# Patient Record
Sex: Male | Born: 1960
Health system: Southern US, Community
[De-identification: ages and names within clinical notes are randomized; demographics above are authoritative.]

## PROBLEM LIST (undated history)

## (undated) ENCOUNTER — Emergency Department (HOSPITAL_COMMUNITY): Admission: EM | Discharge status: Absent without leave | Payer: Medicaid Other

## (undated) DIAGNOSIS — Q2112 Patent foramen ovale: Secondary | ICD-10-CM

## (undated) DIAGNOSIS — R413 Other amnesia: Secondary | ICD-10-CM

## (undated) DIAGNOSIS — F32A Depression, unspecified: Secondary | ICD-10-CM

## (undated) DIAGNOSIS — Q211 Atrial septal defect: Secondary | ICD-10-CM

## (undated) DIAGNOSIS — F419 Anxiety disorder, unspecified: Secondary | ICD-10-CM

## (undated) DIAGNOSIS — F319 Bipolar disorder, unspecified: Secondary | ICD-10-CM

## (undated) DIAGNOSIS — F329 Major depressive disorder, single episode, unspecified: Secondary | ICD-10-CM

## (undated) DIAGNOSIS — I639 Cerebral infarction, unspecified: Secondary | ICD-10-CM

## (undated) DIAGNOSIS — M1990 Unspecified osteoarthritis, unspecified site: Secondary | ICD-10-CM

## (undated) DIAGNOSIS — D649 Anemia, unspecified: Secondary | ICD-10-CM

## (undated) DIAGNOSIS — T7840XA Allergy, unspecified, initial encounter: Secondary | ICD-10-CM

## (undated) HISTORY — PX: POLYPECTOMY: SHX149

## (undated) HISTORY — DX: Patent foramen ovale: Q21.12

## (undated) HISTORY — DX: Major depressive disorder, single episode, unspecified: F32.9

## (undated) HISTORY — DX: Atrial septal defect: Q21.1

## (undated) HISTORY — DX: Anemia, unspecified: D64.9

## (undated) HISTORY — PX: COLONOSCOPY: SHX174

## (undated) HISTORY — DX: Depression, unspecified: F32.A

## (undated) HISTORY — DX: Anxiety disorder, unspecified: F41.9

## (undated) HISTORY — DX: Other amnesia: R41.3

## (undated) HISTORY — DX: Unspecified osteoarthritis, unspecified site: M19.90

## (undated) HISTORY — PX: RHINOPLASTY: SUR1284

## (undated) HISTORY — DX: Allergy, unspecified, initial encounter: T78.40XA

---

## 1998-07-13 ENCOUNTER — Emergency Department (HOSPITAL_COMMUNITY): Admission: EM | Admit: 1998-07-13 | Discharge: 1998-07-13 | Payer: Self-pay

## 2004-02-07 ENCOUNTER — Ambulatory Visit: Payer: Self-pay | Admitting: Internal Medicine

## 2004-03-15 HISTORY — PX: PATENT FORAMEN OVALE CLOSURE: SHX2181

## 2004-05-01 ENCOUNTER — Ambulatory Visit: Payer: Self-pay | Admitting: Physical Medicine & Rehabilitation

## 2004-05-01 ENCOUNTER — Inpatient Hospital Stay (HOSPITAL_COMMUNITY): Admission: EM | Admit: 2004-05-01 | Discharge: 2004-05-10 | Payer: Self-pay | Admitting: Emergency Medicine

## 2004-05-04 ENCOUNTER — Encounter (INDEPENDENT_AMBULATORY_CARE_PROVIDER_SITE_OTHER): Payer: Self-pay | Admitting: Cardiology

## 2004-05-05 ENCOUNTER — Encounter: Payer: Self-pay | Admitting: Cardiology

## 2004-05-13 ENCOUNTER — Inpatient Hospital Stay (HOSPITAL_COMMUNITY): Admission: EM | Admit: 2004-05-13 | Discharge: 2004-05-22 | Payer: Self-pay | Admitting: Emergency Medicine

## 2004-05-27 ENCOUNTER — Encounter: Admission: RE | Admit: 2004-05-27 | Discharge: 2004-08-25 | Payer: Self-pay | Admitting: Internal Medicine

## 2004-05-27 ENCOUNTER — Ambulatory Visit: Payer: Self-pay | Admitting: Psychology

## 2004-06-01 ENCOUNTER — Ambulatory Visit: Payer: Self-pay | Admitting: Internal Medicine

## 2004-07-13 ENCOUNTER — Ambulatory Visit: Payer: Self-pay | Admitting: Internal Medicine

## 2004-08-06 ENCOUNTER — Ambulatory Visit: Payer: Self-pay | Admitting: Internal Medicine

## 2004-10-05 ENCOUNTER — Encounter: Admission: RE | Admit: 2004-10-05 | Discharge: 2004-10-05 | Payer: Self-pay | Admitting: Cardiology

## 2004-10-09 ENCOUNTER — Ambulatory Visit (HOSPITAL_COMMUNITY): Admission: RE | Admit: 2004-10-09 | Discharge: 2004-10-10 | Payer: Self-pay | Admitting: Cardiology

## 2004-10-29 ENCOUNTER — Ambulatory Visit: Payer: Self-pay | Admitting: Internal Medicine

## 2005-01-26 ENCOUNTER — Ambulatory Visit: Payer: Self-pay | Admitting: Internal Medicine

## 2005-08-06 ENCOUNTER — Ambulatory Visit: Payer: Self-pay | Admitting: Internal Medicine

## 2005-12-02 ENCOUNTER — Ambulatory Visit: Payer: Self-pay | Admitting: Internal Medicine

## 2006-02-05 ENCOUNTER — Emergency Department (HOSPITAL_COMMUNITY): Admission: EM | Admit: 2006-02-05 | Discharge: 2006-02-05 | Payer: Self-pay | Admitting: Family Medicine

## 2006-03-22 ENCOUNTER — Ambulatory Visit: Payer: Self-pay | Admitting: Internal Medicine

## 2006-10-27 DIAGNOSIS — J309 Allergic rhinitis, unspecified: Secondary | ICD-10-CM | POA: Insufficient documentation

## 2006-12-21 ENCOUNTER — Emergency Department (HOSPITAL_COMMUNITY): Admission: EM | Admit: 2006-12-21 | Discharge: 2006-12-21 | Payer: Self-pay | Admitting: Emergency Medicine

## 2007-01-11 ENCOUNTER — Ambulatory Visit: Payer: Self-pay | Admitting: Family Medicine

## 2007-01-11 ENCOUNTER — Ambulatory Visit (HOSPITAL_COMMUNITY): Admission: RE | Admit: 2007-01-11 | Discharge: 2007-01-11 | Payer: Self-pay | Admitting: Family Medicine

## 2007-01-11 LAB — CONVERTED CEMR LAB
Albumin: 4.4 g/dL (ref 3.5–5.2)
Alkaline Phosphatase: 44 units/L (ref 39–117)
Basophils Absolute: 0 10*3/uL (ref 0.0–0.1)
Chloride: 105 meq/L (ref 96–112)
Cholesterol: 158 mg/dL (ref 0–200)
Creatinine, Ser: 1.03 mg/dL (ref 0.40–1.50)
Eosinophils Absolute: 0.4 10*3/uL (ref 0.0–0.7)
HCT: 44 % (ref 39.0–52.0)
LDL Cholesterol: 105 mg/dL — ABNORMAL HIGH (ref 0–99)
MCHC: 32.5 g/dL (ref 30.0–36.0)
MCV: 90.9 fL (ref 78.0–100.0)
Neutrophils Relative %: 59 % (ref 43–77)
PSA: 0.25 ng/mL (ref 0.10–4.00)
Platelets: 242 10*3/uL (ref 150–400)
RBC: 4.84 M/uL (ref 4.22–5.81)
RDW: 12.8 % (ref 11.5–14.0)
Sodium: 140 meq/L (ref 135–145)
Total Bilirubin: 0.9 mg/dL (ref 0.3–1.2)
Total CHOL/HDL Ratio: 3.9

## 2007-02-22 ENCOUNTER — Ambulatory Visit: Payer: Self-pay | Admitting: Family Medicine

## 2007-05-19 ENCOUNTER — Ambulatory Visit: Payer: Self-pay | Admitting: *Deleted

## 2007-05-19 ENCOUNTER — Ambulatory Visit: Payer: Self-pay | Admitting: Family Medicine

## 2007-05-24 ENCOUNTER — Ambulatory Visit: Payer: Self-pay | Admitting: Family Medicine

## 2007-12-21 ENCOUNTER — Ambulatory Visit: Payer: Self-pay | Admitting: Internal Medicine

## 2008-07-22 ENCOUNTER — Ambulatory Visit: Payer: Self-pay | Admitting: Internal Medicine

## 2008-08-16 ENCOUNTER — Ambulatory Visit: Payer: Self-pay | Admitting: Family Medicine

## 2008-11-12 ENCOUNTER — Ambulatory Visit: Payer: Self-pay | Admitting: Family Medicine

## 2008-11-12 LAB — CONVERTED CEMR LAB
HDL: 56 mg/dL (ref >39)
LDL Cholesterol: 84 mg/dL (ref 0–99)
Total CHOL/HDL Ratio: 2.7
Triglycerides: 43 mg/dL (ref <150)
VLDL: 9 mg/dL (ref 0–40)

## 2008-11-19 ENCOUNTER — Ambulatory Visit: Payer: Self-pay | Admitting: Family Medicine

## 2009-04-22 ENCOUNTER — Ambulatory Visit: Payer: Self-pay | Admitting: Family Medicine

## 2009-07-17 ENCOUNTER — Ambulatory Visit: Payer: Self-pay | Admitting: Family Medicine

## 2009-07-23 ENCOUNTER — Ambulatory Visit: Payer: Self-pay | Admitting: Internal Medicine

## 2010-01-13 ENCOUNTER — Encounter (INDEPENDENT_AMBULATORY_CARE_PROVIDER_SITE_OTHER): Payer: Self-pay | Admitting: Family Medicine

## 2010-01-13 LAB — CONVERTED CEMR LAB
BUN: 17 mg/dL (ref 6–23)
Basophils Absolute: 0 10*3/uL (ref 0.0–0.1)
CO2: 28 meq/L (ref 19–32)
Calcium: 9.4 mg/dL (ref 8.4–10.5)
Chloride: 102 meq/L (ref 96–112)
Creatinine, Ser: 1.15 mg/dL (ref 0.40–1.50)
Free T4: 1.21 ng/dL (ref 0.80–1.80)
HCT: 44.4 % (ref 39.0–52.0)
Helicobacter Pylori Antibody-IgG: <0.4
Hemoglobin: 14.7 g/dL (ref 13.0–17.0)
MCV: 87.7 fL (ref 78.0–100.0)
Monocytes Absolute: 0.6 10*3/uL (ref 0.1–1.0)
Neutro Abs: 4.6 10*3/uL (ref 1.7–7.7)
Potassium: 4.7 meq/L (ref 3.5–5.3)
Sodium: 138 meq/L (ref 135–145)

## 2010-07-31 NOTE — Cardiovascular Report (Signed)
Andrew Kaiser, Andrew Kaiser              ACCOUNT NO.:  [id number]   MEDICAL RECORD NO.:  [id number]          PATIENT TYPE:  OIB   LOCATION:  2899                         FACILITY:  [facility]   PHYSICIAN:  [name], MD       DATE OF BIRTH:  03/08/1961   DATE OF PROCEDURE:  DATE OF DISCHARGE:                              CARDIAC CATHETERIZATION   ADDENDUM:  Please note this is an addendum to the previous dictation.   PERFORMING PHYSICIAN:  Dr. Yates Decamp.   SECOND ASSISTANT:  Dr. Lenise Herald.       JRG/MEDQ  D:  10/09/2004  T:  10/09/2004  Job:  [id number]

## 2010-07-31 NOTE — H&P (Signed)
Andrew Kaiser, Andrew Kaiser              ACCOUNT NO.:  [id number]   MEDICAL RECORD NO.:  [id number]          PATIENT TYPE:  INP   LOCATION:  1823                         FACILITY:  [facility]   PHYSICIAN:  Corinna L. Lendell Caprice, MDDATE OF BIRTH:  [birth year]   DATE OF ADMISSION:  05/13/2004  DATE OF DISCHARGE:                                HISTORY & PHYSICAL   CHIEF COMPLAINT:  Vomiting.   HISTORY OF PRESENT ILLNESS:  Andrew Kaiser is a 50 year old black male who was  recently hospitalized on our service for stroke which was presumed to be  embolic, but there was no source of embolism found on transthoracic or  transesophageal echocardiogram, who had significant cognitive judgment,  memory impairment and was deemed incompetent to make informed medical  decisions or give consent.  He eloped from the hospital on the 26th of  February and apparently his family had been looking for him; they found him  today and his mother had some commitment papers drawn up by the magistrate  and he was brought to the emergency room by police.  There is some reported  history of vomiting today several times.  He has no abdominal pain.  He was  awaiting placement in the hospital and his workup was essentially complete.  He had been placed on Aggrenox b.i.d. and had been found to have abnormal  protein C and S levels which could have been reactive and the plan was to  repeat these in 4 weeks.  The patient is hungry currently and does not feel  that he needs any further treatment for his stroke.   PAST MEDICAL HISTORY:  As above.   MEDICATIONS:  He had been on Aggrenox in the hospital.   SOCIAL HISTORY:  He had a urine drug screen that was positive for marijuana  on his last hospitalization.   FAMILY HISTORY:  His sister also had a stroke and DVT at age 54.   REVIEW OF SYSTEMS:  Review of systems as above, otherwise negative.   PHYSICAL EXAMINATION:  VITAL SIGNS:  Please see nursing notes for vital  signs.  GENERAL:  In general, the patient is well-developed, well-nourished, calm,  in no acute distress on the stretcher.  HEENT:  Normocephalic, atraumatic.  Pupils are equal, round and reactive to  light.  Sclerae anicteric.  Mucous membranes moist.  Extraocular movements  are intact.  NECK:  Neck is supple.  No carotid bruits.  LUNGS:  Lungs are clear to auscultation bilaterally without wheezes, rhonchi  or rales.  CARDIOVASCULAR:  Regular rate and rhythm without murmurs, gallops or rubs.  ABDOMEN:  Normal bowel sounds.  Soft, nontender and non-distended.  GU AND RECTAL:  Deferred.  EXTREMITIES:  No clubbing, cyanosis, or edema.  SKIN:  No rash.  PSYCHIATRIC:  The patient is calm and cooperative.  NEUROLOGIC:  The patient is alert and oriented.  Cranial nerves and  sensorimotor exam are intact.  His speech is fluent.  He does not remember  me and has poor memory.  He continues to display poor reason and judgment,  as he feels that he needs  no further treatment for this stroke and denies  that this major stroke was much of a problem for him.   LABORATORIES:  CBC is normal.  Complete metabolic panel normal.   ASSESSMENT AND PLAN:  1.  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  The patient continues to      have deficits in memory, judgment and reasoning.  He is unsafe to care      for himself, as evidenced by his recent impulsive behavior and eloping      from the hospital, and having disappeared from his family members for 2      days.  The patient will be admitted back to the hospital.  I will      continue physical therapy, occupational therapy, speech therapy and get      case manager involved for placement.  I have signed involuntary      commitment papers for medical reasons, as the patient does not have the      capacity to give informed consent.  I will continue Aggrenox.  2.  Vomiting:  He will get  as-needed Phenergan.      CLS/MEDQ  D:  05/13/2004  T:  05/13/2004  Job:  [id number]

## 2010-07-31 NOTE — Procedures (Signed)
HISTORY:  This is a 50 year old patient who is being evaluated for  confusion.  Patient was felt to have had seizure-like activity at home.  Patient is being evaluated for possible epilepsy.   This is a portable EEG recording.  No skull defects are noted.  Medications  include aspirin, Ativan, Phenergan.   EEG CLASSIFICATION:  Normal awake.   DESCRIPTION OF RECORDING:  The background rhythm of this recording consists  of a fairly well modulated medium amplitude alpha rhythm of 9 Hz that is  reactive to eye opening and closure.  As the record progresses, patient  appears to remain in awakened state throughout the recording.  At no time  during the recording does there appear to be evidence of spike, spike wave  discharges or evidence of focal slowing.  Hyperventilation and photic  stimulation were not performed during the recording.  EKG monitor shows no  evidence of cardiac rhythm abnormalities with a heart rate of 66.   IMPRESSION:  This is a normal EEG recording in awakened state.  No evidence  of ictal or interictal discharges were seen.      ZOX:WRUE  D:  05/04/2004 12:39:30  T:  05/04/2004 21:23:13  Job #:  [id number]

## 2010-07-31 NOTE — Consult Note (Signed)
NAME:  Andrew Kaiser, WIECK NO.:  [id number]   MEDICAL RECORD NO.:  [id number]          PATIENT TYPE:  INP   LOCATION:  3102                         FACILITY:  [facility]   PHYSICIAN:  Melvyn Novas, M.D.  DATE OF BIRTH:  Sep 18, 1960   DATE OF CONSULTATION:  05/01/2004  DATE OF DISCHARGE:                                   CONSULTATION   This an only 50 year old and actually much younger-appearing African-  American right-handed gentleman, who presents to the ER with a sudden  confusional spell that was thought to be possibly postictal.  He had no past  medical history problems, past medical history of alcohol or drug use, and  works for a Administrator, [employer].  At 7:30 p.m. on February 16, he felt the right  side of his body not responding.  The right leg appeared weak, and  apparently he had a similar spell already on October 15.  Symptoms ceased by  the time EMS arrived.  The patient's sister had noticed that also motor  symptoms were improving to the level where they became no longer  recognizable and the patient was still not able to follow verbal commands  very well and seemed to be unable to comprehend.  He was therefore admitted  through the [facility] ER to the hospitalist service.  Corinna L. Lendell Caprice,  M.D., admitted the patient and could get no information on medication,  illness history or family history.  He has apparently had a sister that had  a stroke at age 75.  He has adult and healthy children.   PHYSICAL EXAMINATION:  GENERAL:  The patient appears afebrile and  normocephalic.  VITAL SIGNS:  Blood pressure was only 123/50, pulse rate 76, respiratory  rate 18.  The patient's Chem-7 appears normal, and he had a negative drug  screen and no abnormalities on PT and INR.  NEUROLOGIC:  Pupils reactive, equal to light and accommodation.  He has full  extraocular movement but appears aphasic, but he can follow commands  occasionally but not continuously or  consistently.  He had undergone an MRI  of the brain to see if any left hemispheric abnormalities occurred.  Indeed,  the MRI returned yesterday on May 01, 2004, positive for a left  temporoparietal acute stroke as well as for a right ACA stroke.  Dr.  Lendell Caprice had already ordered a 2 D echocardiogram, carotid studies, PT, OT  and ST evaluation and a possible rehab consult.  An MRA of the brain was  added.  The patient's sister, Dr. Eden Lathe, a neonatologist, was  involved in the decision-making process and is kept up about our progress.  After the patient was returned from MRI, we continued our neurologic exam.  The patient is alert, has expressive aphasia, tries to speak but cannot  express himself.  He also is unable to repeat.  He has no dysarthria,  however.  The words he can articulate are clear.  He can follow simple motor  commands.  He is moving all extremities.  Also, he has mild right-sided  pronator drift and he has already  deep tendon reflex asymmetry with  prominent brisk reflexes throughout the right body hemisphere.  His hand  grip on the right is slightly weaker than on the left, but he is able to  perform.  He has a decreased right nasolabial fold.  I was able to elicit an  upgoing toe on the right.  The overall motor function on the right is just  mildly impaired at this point.  We were not able to detect any carotid  bruits.   Due to fall precautions, the patient was asked to stay in the bed.  We will  ask PT to get him up out of bed three times a day, and the patient's further  workup for hypercoagulability and other sources of embolic stroke is  pending.   DIAGNOSIS:  Embolic strokes by two different foci, left and right  hemisphere, acute.  No cardiac history or hypercoagulability history known.  Sickle cell test returned already negative.  Sedimentation rate was 2.  All  other tests are pending.      CD/MEDQ  D:  05/02/2004  T:  05/04/2004  Job:   [id number]   cc:   Corinna L. Lendell Caprice, MD

## 2010-07-31 NOTE — Discharge Summary (Signed)
NAMEDEMICHAEL, TRAUM              ACCOUNT NO.:  [id number]   MEDICAL RECORD NO.:  [id number]          PATIENT TYPE:  INP   LOCATION:  5712                         FACILITY:  [facility]   PHYSICIAN:  Jackie Plum, M.D.DATE OF BIRTH:  08-30-1960   DATE OF ADMISSION:  05/12/2004  DATE OF DISCHARGE:                                 DISCHARGE SUMMARY   DISCHARGE DIAGNOSES:  1.  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  Patient is deemed incompetent.  2.  Vomiting, resolved.   DISCHARGE MEDICATIONS:  1.  Aggrenox one capsule b.i.d.  2.  Aricept 5 mg q.h.s.  3.  Ativan p.r.n. 0.5 t.i.d.   DISCHARGE LABORATORIES:  Sodium 142, potassium 3.9, chloride 109, CO2 27,  glucose 88, BUN 14, creatinine 0.9, calcium 9.0.   CONSULTS:  Not applicable.   PROCEDURES:  Not applicable.   CONDITION ON DISCHARGE:  Improved, satisfactory.   CONSULTS:  Dr. Jeanie Sewer of psychiatry   REASON FOR ADMISSION:  Please see admission H&P dictated by Dr. Lendell Caprice  dated May 13, 2004 for full assessment and plan and patient's presentation.  The patient was admitted for stroke.  During that admission work-up  including echocardiogram was unremarkable.  In fact, the cause of his stroke  was not categorically known.  However, he was started on Aggrenox for  presumptive embolic source.  At that time his drug screen was positive for  marijuana.  While he was in the hospital patient was awaiting for placement  and at that time he eloped and was subsequently found by the mother and  brought to the hospital.  On admission he had some vomiting without any  other significant symptomatic complaints.  Was admitted for placement and  anti-emesis.   HOSPITAL COURSE:  Patient was admitted to the hospitalists service.  There  were no significant issues.  No further work-up was initiated.  He  was seen  by Dr. Jeanie Sewer who deemed the patient not competent to make decisions and  recommended starting Aricept which was initiated.  Patient was planned for  skilled nursing facility placement.  However, in view of the fact that he  was ambulatory and the fact that his cognition improved somewhat, it was  deemed that the patient was appropriate for 24-hour care which his mother  agreed to give patient at home.  He is being discharged with outpatient  follow-up at [facility] on May 27, 2004.  He also  has appointment to see Dr. Leonides Cave a neuropsychologist for follow-up.  He is  discharged home in stable satisfactory condition   Discharge instructions.  Patient's family report to [employer]  or Bostyn Bogie of choice if he has any other problems.      GO/MEDQ  D:  05/22/2004  T:  05/22/2004  Job:  [id number]

## 2010-07-31 NOTE — Discharge Summary (Signed)
NAME:  Andrew Kaiser, Andrew Kaiser              ACCOUNT NO.:  [id number]   MEDICAL RECORD NO.:  [id number]          PATIENT TYPE:  INP   LOCATION:  3024                         FACILITY:  [facility]   PHYSICIAN:  Melissa L. Ladona Ridgel, MD  DATE OF BIRTH:  [birth year]   DATE OF ADMISSION:  05/01/2004  DATE OF DISCHARGE:  05/10/2004                                 DISCHARGE SUMMARY   COMMENT:  Please note, the patient left the hospital without telling anyone  on May 10, 2004.   DISCHARGING DIAGNOSES:  1.  Stroke.  The patient's stroke involved the right anterior cerebral      artery and the left anterior middle cerebral artery.  The event was      highly suspicious for an embolic event and the event appeared subacute.      The patient was left with a residual aphasia and she remained with      decreased decision-making capacity.  The patient was being evaluated for      placement for speech rehabilitation at the time of his elopement.  2.  Nausea and vomiting.  This was likely secondary to Aggrenox being taken      on an empty stomach.  This has resolved at the time of his elopement.   MEDICATIONS AT TIME OF DISCHARGE:  1.  Aggrenox one tablet b.i.d. x2 weeks.  2.  Protonix 40 mg daily.   HISTORY OF PRESENT ILLNESS:  The patient is a 50 year old African American  male who presented to the emergency room with seizure-like activity  witnessed by his son.  On the day of admission, the patient was witnessed to  have fallen to the ground and showed some shaking of his right arm and right  leg and lasted 10-15 seconds.  The patient was brought to the emergency room  for further evaluation.  On evaluation, the patient was alert, but had an  expressive aphasia, was unable to follow commands and appeared to have no  insight into his disease.   HOSPITAL COURSE:  The patient was worked up from a hypercoagulability  standpoint, obtaining a protein C, protein S, factor V Leiden, antithrombin  III, a  cardiolipin antibody and lupus anticoagulant, as well as an RPR.  He  was initially treated with aspirin, which was converted to Aggrenox.  During  the course of the stay, the patient worked with PT, OT and speech therapy to  attempt to correct some of his deficits.  Carotid Dopplers were also  completed, which showed no obvious source for emboli.  EEG was undertaken,  which did not confirm any seizure-like activity.  Because of the extent of  his injury and his inability to make good judgments for himself or  differentiate between good choices and bad choices, the patient was  maintained in the hospital.  We assisted with bringing his mother home, who  was going to help [place] with his care and the recommendation was that the  patient should go to an outpatient rehab for further speech therapy as well  as cognitive therapy.  During the course of the hospital stay,  the patient  did complain of some leg pain.  Dopplers of the lower extremities were  completed which showed no DVT.  Pain resolved without any difficulty.  The  patient's hospital course was further complicated by mild nausea and  vomiting related to taking Aggrenox.  We made some adjustments on when his  dose was given and started him on a PPI for mild gastritis, which was  diagnosed empirically.  Psychiatry was asked to evaluate the patient for  capacity, found him to have impaired judgment and recommended that he be  committed if he were to wish to leave the hospital.  Recommendations were  made for mood and behavioral control.  On May 10, 2004, the patient was  noted to have left the floor; his destination was not known and he was not  accompanied by anyone.  Commitment papers were completed on his behavior in  the likelihood that he would return to the hospital;  unfortunately, the patient did not return to the hospital.  His family was  notified of him leaving and [facility] police were also alerted.  At this  time, there  is no further information in the record regarding his final  destination nor the outcome of his elopement.      MLT/MEDQ  D:  06/29/2004  T:  06/29/2004  Job:  [id number]   cc:   Melvyn Novas, M.D.  [address]  [address]  [place]  [place] [address]  Fax: [phone]

## 2010-07-31 NOTE — Cardiovascular Report (Signed)
NAMEREVIS, WHALIN              ACCOUNT NO.:  [id number]   MEDICAL RECORD NO.:  [id number]          PATIENT TYPE:  OIB   LOCATION:  2899                         FACILITY:  [facility]   PHYSICIAN:  [name], MD       DATE OF BIRTH:  06-Oct-1960   DATE OF PROCEDURE:  10/09/2004  DATE OF DISCHARGE:                              CARDIAC CATHETERIZATION   PROCEDURE:  ICE (intracardiac echocardiogram).   Using sterile precautions and 8-French left femoral venous access an  intracardiac echocardiogram catheter was easily advanced into the inferior  vena cava and into the right atrium and the cardiac structures were  carefully analyzed.  Then the PFO was confirmed by color Doppler  examination.  It was a small PFO measuring 3.5-5 mm.   Post procedural ICE confirmed excellent position of the 23mm CardioSEAL  septal occluder with no residual shunting either by color Doppler or by  double contrast injection.   Procedure for CardioSEAL intracardiac occluder implantation:  Under sterile  precautions initially using an 8-French right femoral venous access a  multiple purpose A2 catheter was utilized to cross into the left atrium.  Left atrial position was confirmed by doing oxygen saturation.  Then after  giving 5000 units of heparin and ACT maintained at more than 250 a 0.035  inch by 260 cm Rosen J-wire was introduced into the left atrium and into the  left upper pulmonary vein.  Then an 11-French PFO closure transeptal sheath  Criselda Peaches) was introduced through the IVC and the sheath was gently crossed  over to the left atrium through the PFO.  All these maneuvers were confirmed  through the ICE.  Then we used a sizing balloon to size the PFO.  A 25 mm  PFO closure balloon was utilized.  The PFO measured about 5 mm.  Hence, it  was decided to proceed with use of a 23 mm CardioSEAL septal occluder  device.  Then, the Rosen wire was withdrawn after the [facility] sheath was  introduced in the left  atrium and the CardioSEAL septal occluder delivery  catheter was introduced through the [place] sheath and the left atrial side  of the device was released and confirmed through the ICE.  The device was  gently pulled back to the site of the PFO and the right atrial side of the  occluder device was then released.  Excellent positioning and sandwiching of  the intra-atrial septum was confirmed through the ICE.  Then the device was  released without any complications.  Both color Doppler and a double  contrast injection was performed post procedure.  Then the [facility] sheath was  withdrawn out of the body in the usual fashion and a short 11-French sheath  was introduced through the right femoral vein and sutured in place.  During  the procedure a total of 7000 units of IV heparin was administered and ACT  was maintained at greater than  250.  Patient tolerated the procedure well.  There was no immediate  complications noted.  RECOMENDATIONS: Continue plavix for 3 months with aggrenox and aggrenox or  asa  indefinately. 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       JRG/MEDQ  D:  10/09/2004  T:  10/09/2004  Job:  [id number]   cc:   Gordy Savers, M.D. [facility]   Pramod P. Pearlean Brownie, MD  Fax: [phone]

## 2010-07-31 NOTE — H&P (Signed)
[name], Kaiser              ACCOUNT NO.:  [id number]   MEDICAL RECORD NO.:  [id number]          PATIENT TYPE:  OIB   LOCATION:  2899                         FACILITY:  [facility]   PHYSICIAN:  Andrew Hilts. Jacinto Halim, MD       DATE OF BIRTH:  03/07/1961   DATE OF ADMISSION:  10/09/2004  DATE OF DISCHARGE:                                HISTORY & PHYSICAL   PROCEDURES PERFORMED:  1.  Intracardiac echocardiogram.  2.  Closure of the atrial level septal defect with a CardioSEAL septal      occluder.   BRIEF HISTORY:  Andrew Kaiser is a 50 year old African-American male with no  significant prior cerebrovascular disease history.  Was admitted to [facility] on May 01, 2004 with new onset of embolic stroke in his right  anterior cerebral artery and left middle cerebral artery.  He had a strongly  positive PCD bubble study.   PAST MEDICAL HISTORY:  As dictated above.  No diabetes, hypertension.   CURRENT MEDICATIONS:  Aggrenox one p.o. b.i.d.   REVIEW OF SYSTEMS:  No bowel or bladder changes.  No new neurologic  weakness.  Other systems were negative.   ALLERGIES:  No known drug allergies.   SOCIAL HISTORY:  He is divorced.  He has one son.  Lives with his mother.  He drinks occasional beer.  He does use cigars one or two a day which he  quit in March of 2006.   FAMILY HISTORY:  There is family history of myocardial infarction.  Father  died at age of 82.  Mother had a stroke at the age of 16.   PHYSICAL EXAMINATION:  GENERAL:  He is well-built, nourished.  He appears to  be in no acute distress.  VITAL SIGNS:  Heart rate 76 beats per minute, respirations 16, blood  pressure 120/76.  CARDIAC:  S1, S2 was normal.  No gallop, no murmur.  CHEST:  Bilateral equal breath sounds.  No crackles.  ABDOMEN:  Benign.   IMPRESSION:  1.  Embolic cerebrovascular accident on February 17th, 2006, affecting his      right anterior communicating artery and left middle cerebral artery with  residual memory, judgment, and reasoning defects which are completely      improved.  2.  Redundant atrial septum with patent foramen ovale.  3.  Would proceed with PFO closure . Risks, alternatives and benefits have      been discussed previously.       JRG/MEDQ  D:  10/09/2004  T:  10/09/2004  Job:  [id number]

## 2011-01-13 ENCOUNTER — Emergency Department (HOSPITAL_COMMUNITY)
Admission: EM | Admit: 2011-01-13 | Discharge: 2011-01-14 | Disposition: A | Discharge status: Other Acute Inpatient Hospital | Payer: Medicaid Other | Source: Home / Self Care | Attending: Emergency Medicine | Admitting: Emergency Medicine

## 2011-01-13 ENCOUNTER — Emergency Department (HOSPITAL_COMMUNITY): Payer: Medicaid Other

## 2011-01-13 DIAGNOSIS — F29 Unspecified psychosis not due to a substance or known physiological condition: Secondary | ICD-10-CM | POA: Insufficient documentation

## 2011-01-13 DIAGNOSIS — R079 Chest pain, unspecified: Secondary | ICD-10-CM | POA: Insufficient documentation

## 2011-01-13 DIAGNOSIS — S52609A Unspecified fracture of lower end of unspecified ulna, initial encounter for closed fracture: Secondary | ICD-10-CM | POA: Insufficient documentation

## 2011-01-13 DIAGNOSIS — F309 Manic episode, unspecified: Secondary | ICD-10-CM | POA: Insufficient documentation

## 2011-01-13 LAB — DIFFERENTIAL
Basophils Absolute: 0 10*3/uL (ref 0.0–0.1)
Basophils Relative: 0 % (ref 0–1)
Eosinophils Relative: 1 % (ref 0–5)
Lymphocytes Relative: 15 % (ref 12–46)
Lymphs Abs: 1.3 10*3/uL (ref 0.7–4.0)
Monocytes Absolute: 1.1 10*3/uL — ABNORMAL HIGH (ref 0.1–1.0)
Monocytes Relative: 12 % (ref 3–12)
Neutrophils Relative %: 72 % (ref 43–77)

## 2011-01-13 LAB — COMPREHENSIVE METABOLIC PANEL
Albumin: 4.4 g/dL (ref 3.5–5.2)
BUN: 14 mg/dL (ref 6–23)
CO2: 25 mEq/L (ref 19–32)
Creatinine, Ser: 1.04 mg/dL (ref 0.50–1.35)
GFR calc Af Amer: >90 mL/min (ref >90)
Total Protein: 8 g/dL (ref 6.0–8.3)

## 2011-01-13 LAB — CBC
Platelets: 282 10*3/uL (ref 150–400)
RDW: 13.6 % (ref 11.5–15.5)
WBC: 9 10*3/uL (ref 4.0–10.5)

## 2011-01-13 LAB — RAPID URINE DRUG SCREEN, HOSP PERFORMED
Opiates: NOT DETECTED
Tetrahydrocannabinol: NOT DETECTED

## 2011-01-14 ENCOUNTER — Emergency Department (HOSPITAL_COMMUNITY): Payer: Medicaid Other

## 2011-01-14 ENCOUNTER — Inpatient Hospital Stay (HOSPITAL_COMMUNITY)
Admission: AD | Admit: 2011-01-14 | Discharge: 2011-01-21 | DRG: 885 | Disposition: A | Discharge status: Home / Self Care | Payer: Medicaid Other | Attending: Psychiatry | Admitting: Psychiatry

## 2011-01-14 DIAGNOSIS — Z8673 Personal history of transient ischemic attack (TIA), and cerebral infarction without residual deficits: Secondary | ICD-10-CM

## 2011-01-14 DIAGNOSIS — I672 Cerebral atherosclerosis: Secondary | ICD-10-CM

## 2011-01-14 DIAGNOSIS — Z560 Unemployment, unspecified: Secondary | ICD-10-CM

## 2011-01-14 DIAGNOSIS — I679 Cerebrovascular disease, unspecified: Secondary | ICD-10-CM | POA: Diagnosis not present

## 2011-01-14 DIAGNOSIS — Z79899 Other long term (current) drug therapy: Secondary | ICD-10-CM

## 2011-01-14 DIAGNOSIS — S52609A Unspecified fracture of lower end of unspecified ulna, initial encounter for closed fracture: Secondary | ICD-10-CM

## 2011-01-14 DIAGNOSIS — X58XXXA Exposure to other specified factors, initial encounter: Secondary | ICD-10-CM

## 2011-01-14 DIAGNOSIS — F3113 Bipolar disorder, current episode manic without psychotic features, severe: Secondary | ICD-10-CM | POA: Diagnosis present

## 2011-01-15 DIAGNOSIS — F3110 Bipolar disorder, current episode manic without psychotic features, unspecified: Secondary | ICD-10-CM

## 2011-01-15 LAB — TESTOSTERONE, % FREE: Testosterone-% Free: 1.2 % — ABNORMAL LOW (ref 1.6–2.9)

## 2011-01-15 MED ORDER — LAMOTRIGINE 25 MG PO TABS
25.0000 mg | ORAL_TABLET | Freq: Every day | ORAL | Status: DC
  Administered 2011-01-17 – 2011-01-18 (×2): 25 mg via ORAL
  Filled 2011-01-15 (×3): qty 1

## 2011-01-15 MED ORDER — TRAZODONE HCL 100 MG PO TABS
100.0000 mg | ORAL_TABLET | Freq: Every evening | ORAL | Status: DC | PRN
  Administered 2011-01-17 – 2011-01-20 (×2): 100 mg via ORAL
  Filled 2011-01-15: qty 1
  Filled 2011-01-15: qty 15

## 2011-01-15 MED ORDER — DIVALPROEX SODIUM ER 500 MG PO TB24
500.0000 mg | ORAL_TABLET | Freq: Every day | ORAL | Status: DC
  Administered 2011-01-17 – 2011-01-18 (×2): 500 mg via ORAL
  Filled 2011-01-15 (×3): qty 1

## 2011-01-15 MED ORDER — ALUM & MAG HYDROXIDE-SIMETH 200-200-20 MG/5ML PO SUSP: 30.0000 mL | ORAL | Status: DC | PRN

## 2011-01-15 MED ORDER — ACETAMINOPHEN 325 MG PO TABS
650.0000 mg | ORAL_TABLET | Freq: Four times a day (QID) | ORAL | Status: DC | PRN
Start: 2011-01-15 — End: 2011-01-21
  Administered 2011-01-19 – 2011-01-20 (×2): 650 mg via ORAL

## 2011-01-15 MED ORDER — LAMOTRIGINE 100 MG PO TABS
100.0000 mg | ORAL_TABLET | Freq: Every day | ORAL | Status: DC
  Administered 2011-01-17 – 2011-01-21 (×5): 100 mg via ORAL
  Filled 2011-01-15 (×5): qty 1

## 2011-01-15 MED ORDER — MAGNESIUM HYDROXIDE 400 MG/5ML PO SUSP: 30.0000 mL | Freq: Every day | ORAL | Status: DC | PRN

## 2011-01-17 DIAGNOSIS — F3113 Bipolar disorder, current episode manic without psychotic features, severe: Secondary | ICD-10-CM | POA: Diagnosis present

## 2011-01-17 DIAGNOSIS — I679 Cerebrovascular disease, unspecified: Secondary | ICD-10-CM | POA: Diagnosis not present

## 2011-01-17 NOTE — Progress Notes (Signed)
Pt is intrusive and hyperverbal   He took the cast off his arm this morning and has requested to put it back on this afternoon  To shoot some baskets in the gym   He is grandiose and has racing thoughts   He also requested a smoking patch even though he smokes a cigarette about once a week     Verbal support given  Medications administered and effectiveness monitored   Set limits and discuss safety issues to prevent further injury to his uncasted arm   Q 15 min checks   Pt safe at present

## 2011-01-17 NOTE — Progress Notes (Signed)
[facility] Group Notes:  (Counselor/Nursing/MHT/Case Management/Adjunct) 01/17/2011 1100 AM  Type of Therapy:  Group Therapy, Dance/Movement Therapy   Participation Level:  Active, then left group early  Participation Quality:  Attentive, Redirectable and Sharing  Affect:  Appropriate  Cognitive:  Oriented and Confused  Insight:  Good  Engagement in Group:  Good  Engagement in Therapy:  Good  Modes of Intervention:  Clarification, Problem-solving, Role-play, Socialization and Support  Summary of Progress/Problems: Pt participated in a group on identifing positive supports. Pt spoke about using God as his main support. Pt left group early   [facility] 01/17/2011, 12:00 PM

## 2011-01-17 NOTE — Progress Notes (Signed)
[facility] MD Progress Note  01/17/2011 2:54 PM Subjective: Pt. Was resting comfortably in bed during psych visit today.  He is pleasant and cooperative and voices no new complaints.  Diagnosis:  Axis I: Bipolar, Manic  ADL's:  Intact  Sleep:  Yes,  AEB:  Appetite:  Yes,  AEB:  Suicidal Ideation:   Plan:  No  Intent:  No  Means:  No  Homicidal Ideation:   Plan:  No  Intent:  No  Means:  No  AEB (as evidenced by):  Mental Status: General Appearance Luretha Murphy:  Casual Eye Contact:  Good Motor Behavior:  Restlestness paces through out visit and stands very close to examiner with out regard for personal space. Speech:  Normal and Rate:a little faster than normal. Level of Consciousness:  Alert Mood:  Euthymic Affect:  Appropriate Anxiety Level:  None Thought Process:  Tangential Thought Content:  WNL Perception:  Normal Judgment:  Fair Insight:  Absent Cognition:  Orientation time, place and person Memory Remote Concentration Yes Sleep:     Vital Signs:Blood pressure 114/71, pulse 80, temperature 98.2 F (36.8 C), temperature source Oral, resp. rate 20, height 6\' 1"  (1.854 m), weight 179 lb 15.7 oz (81.64 kg).  Lab Results: No results found for this or any previous visit (from the past 48 hour(s)).  Physical Findings: AIMS:  , ,  ,  ,    CIWA:    COWS:     Treatment Plan Summary: Daily contact with patient to assess and evaluate symptoms and progress in treatment Medication management  Plan: Monitor depakote levels during the week.  Andrew Kaiser 01/17/2011, 2:54 PM

## 2011-01-18 MED ORDER — RISPERIDONE 2 MG PO TABS
2.0000 mg | ORAL_TABLET | Freq: Every day | ORAL | Status: DC
  Administered 2011-01-18: 2 mg via ORAL
  Filled 2011-01-18 (×2): qty 1

## 2011-01-18 NOTE — Progress Notes (Signed)
Recreation Therapy Group Note  Date: 01/18/2011          Time: 0930      Group Topic/Focus: The focus of this group is on promoting emotional and psychological well-being through the process of creative expression, relaxation, socialization, fun and enjoyment.   Participation Level: Minimal  Participation Quality: Redirectable and Resistant   Affect: Labile  Cognitive: Oriented   Additional Comments: Patient contradictory through much of group. Patient first reported he didn't sleep at all and was too tired to participate, then said he slept "great" and participated actively. Patient left group early, reporting he was too tired and needed to lay down, but came back five minutes later saying five minutes is all the sleep he requires.

## 2011-01-18 NOTE — Progress Notes (Signed)
Pt was up in dayroom upon first assessment.  He was given his self-assessment to fill out.  He rated his depression a 1 and hopelessness a 1 today.  He did go down for breakfast and claims to be eating well.   He denies any A/V hallucinations.   He is intrusive and has to be redirected a lot.  He has been up for most groups and is interacting appropriate for the most part.  He did state,"I have been told today by the case manager and counselor that I am manic and can't stop talking"  He went on to say that he now plans not to speak in groups until spoken to and that way he can show he is ready for discharge.  He did say he doesn't understand why he is still here.  He stated,"I haven't been home since last Tuesday and I am ready to leave.  He plans to return home at time of discharge.  His mother did come here this morning with a bag of medications that dated back to 2003.  Discussed with her that pt is on certain medications here and that once he is stabilized on which ever medications that the doctor feels is working then that will be the meds he is sent home to take.  Explained the ways she could destroy his old meds and she voiced understanding.  Pt did ask about if he could have another cast or not.  Informed him he would have to talk with Dr. Rogers Blocker about the plan for his arm.  Pt denies any discomfort and no new order in relation to his arm.
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
## 2011-01-18 NOTE — Progress Notes (Signed)
Adult Services Patient-Family Contact/Session  Attendees:  Case Manager, Mother Amair Shrout, RN Robbie Louis for part of session  Goal(s):  Mother brought home medications for review, had questions  Safety Concerns:  What to do with old medications  Narrative:  RN reviewed the medications and gave Mother options re disposal of the medications.  Advised Mother to discard all meds, new and old, to have a fresh start when patient leaves the hospital.  Mother also had questions for Case Manager about patient applying for Medicaid and about his eligibility for disability.  Case Manager gave her a Medicaid application to start that process.  He has already been denied disability 3 times, is working with attorney Howell Pringle.  Case Manager to complete Releases so that information from this hospitalization can be sent to attorney and Optometrist.  Disability request is based on 3 strokes suffered 6 years ago, all originating in different parts of the brain.  Patient was "normal" prior to those strokes, has only been delusional about having served in the [employer] and been childlike since then.  Mother states he talks about being [place] and living in the "[place]" which is a shed out back of the house which he has cleared out for his things.  She also stated he will not take his medications at home.  She wondered if he would be eligible for an ACTT service, but this is his first psychiatric hospitalization.  Case Manager told her about [facility].  Barrier(s):  None  Interventions:  Complete Releases for attorney and SSA.  Give brochure re [facility].  Give application for Medicaid  Recommendation(s):  Encourage patient to attend [facility] and pursue disability application.  Follow-up Required:  No  Explanation:  Completed already  Sarina Ser 01/18/2011, 3:21 PM

## 2011-01-18 NOTE — Progress Notes (Signed)
[facility] MD Progress Note  01/18/2011 11:47 AM  Diagnosis:  Axis I: Bipolar, Manic  ADL's:  Intact  Sleep:  No  Appetite:  Yes,  AEB:  Suicidal Ideation:   Plan:  No  Intent:  No  Means:  No  Homicidal Ideation:   Plan:  No  Intent:  No  Means:  No  AEB (as evidenced by):  Mental Status: General Appearance Andrew Kaiser:  Neat Eye Contact:  Fair Motor Behavior:  Normal Speech:  Normal Level of Consciousness:  Alert Mood:  Euphoric Affect:  Labile Anxiety Level:  Minimal Thought Process:  Disorganized Thought Content:  Rumination Perception:  Normal Judgment:  Fair Insight:  Present Cognition:  Orientation time, place and person Sleep:     Vital Signs:Blood pressure 159/111, pulse 82, temperature 97.4 F (36.3 C), temperature source Oral, resp. rate 16, height 6\' 1"  (1.854 m), weight 81.64 kg (179 lb 15.7 oz).  Lab Results: No results found for this or any previous visit (from the past 48 hour(s)).  Physical Findings: AIMS:  , ,  ,  ,    CIWA:    COWS:     Treatment Plan Summary: Daily contact with patient to assess and evaluate symptoms and progress in treatment Medication management   Plan: added risperdal 2mg  po bed time  AHLUWALIA,SHAMSHER S 01/18/2011, 11:47 AM

## 2011-01-18 NOTE — Discharge Planning (Signed)
Patient attended Aftercare Planning Group, expressed feeling that he is ready to leave the hospital, challenged Case Manager to tell him why he is even in the hospital.  States he is here involuntarily, and incensed that there is no reason for him to be forced into the hospital.  Does not believe he is manic.  Pleasant throughout group, but excessively talkative and intrusive.  Difficult to redirect, as he did not feel there was anything wrong with him monopolizing the group's time.  Threatened to leave group if not allowed to speak freely.  Case Manager was able to get him to stay, as there were only a few minutes left at that point.  Reported that he never gets more than 4 hours sleep at night, and that is broken up.  Feels this is normal for him, and does not expect/want any changes in that sleep pattern.  Patient also reported that he drinks one-two 24-oz beers daily, usually one in the morning and one in the evening.  Will often add shots of moonshine several times weekly.  Very jocular about this, does not consider this to be a problem.  States he is not addicted to anything.  Bragged also about how he asked the RN to give him a nicotine patch even though he does not smoke sufficient to warrant this.  Wanted to know how the cravings would feel.  No case management needs today.  Sarina Ser 01/18/2011 3:21 PM

## 2011-01-18 NOTE — Progress Notes (Signed)
[facility] Group Notes:  (Counselor/Nursing/MHT/Case Management/Adjunct)  01/18/2011 1:19 PM  Type of Therapy: Group Therapy  Participation Level:  Active  Participation Quality:  Intrusive, Monopolizing and Redirectable  Affect:  Labile  Cognitive:  Oriented  Insight:  Limited  Engagement in Group:  Good  Engagement in Therapy:  Good  Modes of Intervention:  Clarification, Education, Problem-solving and Support  Summary of Progress/Problems: Patient continues to have very little insight into his mania. He stated that he might be hypomanic but he has never been a harm to others. Also stated he liked being this way. Counselor pointed out consequences to mania including his not being able to sleep. He was able to make general statements that if someone doesn't get sleep then their body can shut down and not his mind would not  think straight. He became argumentative later in the group and stated that he never gets consecutive hours of sleep and his usual is only about 4 hours a night. Stated that he was going to go crazy if he didn't get out and get some exercise. Also stated that if he could exercise, this might help him sleep. Patient went from pleasant discussion to stating I'm not going to say anything. Stated that family doesn't approve of him because he is different. Continued to express delusional material of being in the service.   Andrew Kaiser, Aram Beecham 01/18/2011, 1:19 PM

## 2011-01-18 NOTE — Progress Notes (Signed)
Pt remains manic and intrusive. He is asking if we can recast his arm. When asked why he took off the cast he responds, "I didn't. Someone else did." He did attend wrap up group this evening and has been up and around the milieu. Pt redirected as needed without difficulty, reminded of behavioral expectations on unit. Trazadone given for sleep per pt's request. He denies SI/HI/AVH stating, "I never had any." Pt remains safe. Edsel Petrin RN

## 2011-01-19 LAB — URINALYSIS, ROUTINE W REFLEX MICROSCOPIC
Leukocytes, UA: NEGATIVE
Nitrite: NEGATIVE
Specific Gravity, Urine: 1.023 (ref 1.005–1.030)
Urobilinogen, UA: 0.2 mg/dL (ref 0.0–1.0)
pH: 6 (ref 5.0–8.0)

## 2011-01-19 MED ORDER — DIVALPROEX SODIUM ER 500 MG PO TB24
1000.0000 mg | ORAL_TABLET | Freq: Every day | ORAL | Status: DC
  Administered 2011-01-19 – 2011-01-20 (×2): 1000 mg via ORAL
  Filled 2011-01-19 (×3): qty 2

## 2011-01-19 MED ORDER — LAMOTRIGINE 25 MG PO TABS
50.0000 mg | ORAL_TABLET | Freq: Every day | ORAL | Status: DC
  Administered 2011-01-19 – 2011-01-20 (×2): 50 mg via ORAL
  Filled 2011-01-19 (×3): qty 2

## 2011-01-19 MED ORDER — RISPERIDONE 3 MG PO TABS
3.0000 mg | ORAL_TABLET | Freq: Every day | ORAL | Status: DC
  Administered 2011-01-19 – 2011-01-20 (×2): 3 mg via ORAL
  Filled 2011-01-19 (×3): qty 1

## 2011-01-19 NOTE — Progress Notes (Signed)
[facility] MD Progress Note  01/19/2011 9:56 AM  Diagnosis:  Axis I: Bipolar, Manic  ADL's:  Intact  Sleep:  Yes,  AEB:  Appetite:  Yes,  AEB:  Suicidal Ideation:   Plan:  No  Intent:  No  Means:  No  Homicidal Ideation:   Plan:  No  Intent:  No  Means:  No  AEB (as evidenced by):  Mental Status: General Appearance Luretha Murphy:  Casual Eye Contact:  Fair Motor Behavior:  Normal Speech:  Normal Level of Consciousness:  Alert Mood:  Euphoric less Affect:  Labile less Anxiety Level:  Minimal Thought Process:  Relevant Thought Content:  WNL Perception:  Normal Judgment:  Fair Insight:  Present Cognition:  Memory Remote fair Sleep:     Vital Signs:Blood pressure 112/70, pulse 102, temperature 97.7 F (36.5 C), temperature source Oral, resp. rate 20, height 6\' 1"  (1.854 m), weight 81.64 kg (179 lb 15.7 oz).  Lab Results: No results found for this or any previous visit (from the past 48 hour(s)).   Treatment Plan Summary: Increased his meds today depakote to 1000, risperdal to 3mg  , lamictal to 150mg .  Plan:  AHLUWALIA,SHAMSHER S 01/19/2011, 9:56 AM

## 2011-01-19 NOTE — Progress Notes (Signed)
Pt was up in dayroom upon first assessment.  He was given his self-assessment to fill out.  He rated both his depression and hopelessness a 0 again today.  He went back to bed after his am med was given.  He would not wake up for the MHT but he did get up once this nurse went in and reminded him that he wanted to show staff that he could "act right" in groups.  He went on to say that he planned to not talk in groups unless it was his turn or if he had something to add to another patient.  He was very drowsy in the first group and seemed to have a hard time keeping his eyes open.  He did let everyone know that he was listening but he just had his eyes closed to "meditate".   He has been up for the rest of groups and is interacting appropriate for the most part.  He had some medication changes today and he is aware and voiced understanding.  He plans to return home at time of discharge.

## 2011-01-19 NOTE — Progress Notes (Signed)
[facility] Group Notes:  (Counselor/Nursing/MHT/Case Management/Adjunct)  01/19/2011 1:55 PM  Type of Therapy:  Group Therapy  Participation Level:  Active  Participation Quality:  Attentive, Intrusive and Sharing  Affect:  Angry and Irritable  Cognitive:  Oriented  Insight:  Limited  Engagement in Group:  Good  Engagement in Therapy:  Good  Modes of Intervention:  Clarification and Education  Summary of Progress/Problems: Patient continues to be angry about being in the hospital. Micah Flesher through the same discussion from yesterday. Stated that he likes being up and won't trade this for anything. Argued about his sleep and the fact that his family wants him to act a certain way. Not willing to talk about being balanced in his mood. Kept interrupting others but then was getting mad because nobody was talking. Asked to leave the group several times because the hospital was not helping him.   Lavonda Thal, Aram Beecham 01/19/2011, 1:55 PM

## 2011-01-19 NOTE — Progress Notes (Addendum)
Patient attended Aftercare Planning Group, but was late, then left briefly, returned, then left for the remaining time.  Complained of sedation.  Although yesterday had talked about sleeping enough, now states is "sleeping better than when got here."  Acknowledges that he has hired attorney re disability application and appeal, and is in agreement to send records from this hospitalization for inclusion in that process.  Ambrose Mantle, LCSW 01/19/2011  1109AM  Progress on patient goals:  1.  Reduce mania to baseline:  Progress being made through medication management, increased sleep.  Patient was significantly less hyperverbal during Aftercare Planning Group.  2.   Reduce delusions to baseline:  Patient did not complain of any psychosis and was not noted to respond to any internal stimuli; however, previous RN notes indicate he still focuses on delusion that he was in the [employer] and that his mother is responsible for his hospitalization.  3.  Increase sleep to adequate level, i.e. 6 hours nightly for several nights in a row:  Patient slept more by self-report last night, although 2 hours is noted in Epic.  Ambrose Mantle, LCSW 01/19/2011  1115AM

## 2011-01-20 NOTE — Progress Notes (Signed)
Recreation Therapy Group Note  Date: 01/20/2011         Time: 0930      Group Topic/Focus: The focus of this group is on enhancing the patient's understanding of leisure, barriers to leisure, and the importance of engaging in positive leisure activities upon discharge for improved total health.   Participation Level: Active  Participation Quality: Appropriate and Attentive  Affect: Blunted  Cognitive: Oriented   Additional Comments: Patient calmer today, reporting he is sleeping better although its still during the day.   Nija Koopman 01/20/2011 10:05 AM

## 2011-01-20 NOTE — Progress Notes (Signed)
[facility] Group Notes:  (Counselor/Nursing/MHT/Case Management/Adjunct)  01/20/2011 2:31 PM  Type of Therapy:  Group Therapy  Participation Level:  Active  Participation Quality:  Attentive, Drowsy and Sharing  Affect:  Blunted  Cognitive:  Oriented  Insight:  Good  Engagement in Group:  Good  Engagement in Therapy:  Good  Modes of Intervention:  Education, Support and Exploration  Summary of Progress/Problems: Patient was more sedated today because of his medications. He acknowledged that sleep is important to his body. He knows that he should get sleep but continues to state that it is a choice of when he gets his sleep. Confronted him that he might have to change pattern according to those in his household. He talked about in the past he has worked third shift job and has gotten used to sleeping during the day.   [name] 01/20/2011, 2:31 PM

## 2011-01-20 NOTE — Progress Notes (Signed)
Pt was pleasant, cooperative, and continues to use humor in good taste.  Stated that his goal was to close his mouth and do more listening. Pt feels that he accomplished his goal. Support and encouragement were offered.
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
## 2011-01-20 NOTE — Progress Notes (Signed)
Pt was up in dayroom upon first assessment.  He was given his self-assessment to fill out.  He rated his depression a 1 and hopelessness a 1 today.  He has been up for most groups and is interacting appropriate for the most part.  As the day progressed, he seemed to be more appropriate and not as loud and intrusive.   He plans to return home at time of discharge which could possibly be tomorrow.  No new med orders but did request prn tylenol at 1012 for pain in his arm.

## 2011-01-20 NOTE — Progress Notes (Signed)
  Pt continues to improve and be more appropriate on the unit. "This place has been beneficial, because I've gotten the flu and pnue vaccine, good food, nice people and staff". Stated he's had a pleasant stay but if he stays too long it may become unpleasant. Support and encouragement were offered.

## 2011-01-20 NOTE — Progress Notes (Addendum)
Patient seen in Aftercare Planning Group.  He expressed readiness for discharge.  States he slept from 10:30-3:00 and then went back to bed and slept from 3:30 to waking time.  Does not plan to keep up current routine at home, as he plans to sleep during the day ("I love sleeping during the day.") and stay awake at night.  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  Patient made follow-up appointments for patient at [facility] [place].  Medication management will be with Wenda Overland on 02/08/11 at 1:30 and therapy will be with Will Lovey Newcomer 01/27/11 at 1:00.  Progress on patient goals:  1. Reduce mania to baseline: Goal met. 2. Reduce delusions to baseline: Patient did not complain of any psychosis and was not noted to respond to any internal stimuli; however, remains delusional about [employer] and being [place]. 3. Increase sleep to adequate level, i.e. 6 hours nightly for several nights in a row: Patient slept significantly more last night by self-report, and time was not noted in Epic.  Likely discharge tomorrow.  Ambrose Mantle, LCSW 01/20/2011, 3:17 PM   Noting the medications Pt currently is on, Case Manager did extensive research and provided Pt with Patient Assistance Program info on [employer], [employer], and Patient Assistance.  Ambrose Mantle, LCSW 01/20/2011, 3:53 PM

## 2011-01-21 MED ORDER — LAMOTRIGINE 100 MG PO TABS: 100.0000 mg | ORAL_TABLET | Freq: Every day | ORAL | Status: DC

## 2011-01-21 MED ORDER — DIVALPROEX SODIUM ER 500 MG PO TB24: 1000.0000 mg | ORAL_TABLET | Freq: Every day | ORAL | Status: DC

## 2011-01-21 MED ORDER — RISPERIDONE 3 MG PO TABS: 3.0000 mg | ORAL_TABLET | Freq: Every day | ORAL | Status: DC

## 2011-01-21 MED ORDER — LAMOTRIGINE 25 MG PO TABS: 50.0000 mg | ORAL_TABLET | Freq: Every day | ORAL | Status: DC

## 2011-01-21 NOTE — Progress Notes (Signed)
  Pt is in bed resting with eyes closed. Respirations were even and unlabored. Pt appears in No acute distress, Q15 checks continue, pt remains safe on the unit.

## 2011-01-21 NOTE — Progress Notes (Signed)
[facility] Group Notes:  (Counselor/Nursing/MHT/Case Management/Adjunct)  01/21/2011 1:36 PM  Type of Therapy:  Group Therapy  Participation Level:  Active  Participation Quality:  Attentive and Sharing  Affect:  Blunted  Cognitive:  Oriented  Insight:  Limited  Engagement in Group:  Good  Engagement in Therapy:  Good  Modes of Intervention:  Education, Problem-solving and Support  Summary of Progress/Problems: Patient was less drowsy and more subdued. He was less insightful today, and stated that there was nothing wrong with not sleeping for 7 days. He stated that he had done this because he was trying to work on his house before mother got back home. He also stated he liked the Zoloft because it gave him an additional high.    Taiya Nutting, Aram Beecham 01/21/2011, 1:36 PM

## 2011-01-21 NOTE — Progress Notes (Signed)
  Patient discharged home with mother.  Patient received all his belongings, medications and prescriptions.    Suicide prevention information pamphlet given to patient and discussed with patient.  Stated he understood and had no questions.  Denied SI and HI.  Denied A/V hallucinations.  Denied pain.  Excited to be able to go home.  Stated he received all his belongings, prescriptions, medications and understands all his discharge instructions.  Stated he appreciate all the staff has done to help him while at [facility].

## 2011-01-21 NOTE — Discharge Summary (Signed)
Physician Discharge Summary  Patient ID: Andrew Kaiser MRN: [id number] DOB/AGE: 1960-04-15 50 y.o.  Admit date: 01/14/2011 Discharge date: 01/21/2011  Admission Diagnoses: Bipolar disorder under remission   Discharge Diagnoses:  Active Problems:  Bipolar disorder, current episode manic without psychotic features, severe  Cerebral vascular disease   Discharged Condition: stable  Hospital Course: 50 year old male with history of bipolar disorder who was admitted at behavioral health as he was having racing thoughts wasn't not able to sleep properly. Patient denied any suicidal ideations at the time of admission denied hearing any voices at the time of admission. Patient was started on Lamictal as he was taking it before admission to the hospital. The new medications added were Depakote and Risperdal patient responded to these medications well without any side effects. Patient was also taking Zoloft before admission he was advised to discontinue the Zoloft as it can lead him to relapse to  mania. Patient participated in all the groups No agitation reported by the staff patient , mother was contacted and she didn't have any safety concerns for the patient at the time of discharge. At the time of discharge patient is  logical and goal directed not hallucinating or delusional, he is alert awake oriented x3 his insight and judgment improved. He was not having any flight of ideas. Patient was also seen in the treatment team and everybody was agreeable for discharge for this patient. Because his mania resolved and he is compliant with his medication and is motivated to followup in the outpatient setting he does not have any suicidal risk factor. Psychoeducation given to the patient at time of discharge regarding compliance with the medication and followup in the outpatient setting.   Discharge Exam: Blood pressure 116/71, pulse 94, temperature 98.2 F (36.8 C), temperature source Oral, resp. rate  18, height 6\' 1"  (1.854 m), weight 81.64 kg (179 lb 15.7 oz).   Disposition: Home  Current Discharge Medication List    START taking these medications   Details  divalproex (DEPAKOTE ER) 500 MG 24 hr tablet Take 2 tablets (1,000 mg total) by mouth at bedtime. Qty: 30 tablet, Refills: 0    !! lamoTRIgine (LAMICTAL) 100 MG tablet Take 1 tablet (100 mg total) by mouth daily. Qty: 30 tablet, Refills: 0    !! lamoTRIgine (LAMICTAL) 25 MG tablet Take 2 tablets (50 mg total) by mouth at bedtime. Qty: 30 tablet, Refills: 0    risperiDONE (RISPERDAL) 3 MG tablet Take 1 tablet (3 mg total) by mouth at bedtime. Qty: 15 tablet, Refills: 0     !! - Potential duplicate medications found. Please discuss with provider.    CONTINUE these medications which have NOT CHANGED   Details  !! lamoTRIgine (LAMICTAL) 100 MG tablet Take 100 mg by mouth daily.      zolpidem (AMBIEN) 10 MG tablet Take 10 mg by mouth at bedtime as needed. For insomnia      !! - Potential duplicate medications found. Please discuss with provider.    STOP taking these medications     sertraline (ZOLOFT) 100 MG tablet      traZODone (DESYREL) 50 MG tablet        Follow-up Information    Follow up with HICKMAN,MICHELE E, PA on 02/08/2011. (Appointment scheduled at 1:30PM)    Contact information:   [facility] [address] [place] [address] [phone]       Follow up with Candi Leash, counselor on 01/27/2011. (Appointment scheduled  at 1:00PM)    Contact information:   [facility] [place] [address] [place] [phone]         Signed: Katheren Puller 01/21/2011, 11:33 AM

## 2011-01-21 NOTE — Discharge Summary (Signed)
Discharge Note  Date of Admission:  01/14/2011  Date of Discharge:  01/21/2011   Level of Care:  OP  Discharge destination:  Home  Is patient on multiple antipsychotic therapies at discharge:  No    Has Patient had three or more failed trials of antipsychotic monotherapy by history:  No  Patient phone:  [phone] (home)  Patient address:   [address] [place] [address],   Katheren Puller 01/21/2011, 11:10 AM

## 2011-01-21 NOTE — Discharge Planning (Signed)
Met with Patient in Aftercare Planning Group, where he did express readiness for discharge.  Went over Patient Assistance programs, card, processes at length with patient, and advised that he involve his mother in working on these.    Discharge Plan: Go home with Mother, who will pick up. Go to [facility] of the [facility] 01/27/11 at 1:00 for therapy with Will Lovey Newcomer and 02/08/11 at 1:30 for medication management with [facility]. Advised patient to go to [facility] throughout week for supports and education.  Denies SI, HI, A/V hallucinations.  There are no remaining barriers to discharge.  Ambrose Mantle, LCSW 01/21/2011, 11:47 AM

## 2011-01-21 NOTE — Progress Notes (Signed)
Patient discharged home today.  Denies SI/HI/AVH.  Patient has appropriate behavior and affect.  Patient's belongings returned to him.  He was given samples and prescriptions to take with him.

## 2011-01-21 NOTE — Treatment Plan (Signed)
Interdisciplinary Treatment Plan Update (Adult)  Date:  01/21/2011 Time Reviewed:  9:23 AM  Progress in Treatment: Attending groups: Yes. and As evidenced by:  documented in chart Participating in groups:  Yes. and As evidenced by:  full disclosure and interaction Taking medication as prescribed:  Yes.  No refusals noted. Tolerating medication:  Yes.  No side effects noted or reported. Family/Significant othe contact made:  Yes, individual(s) contacted:  mother [name] Patient understands diagnosis:  Yes.  Verbalizes understanding of the sleep issues and the impact on his behavior.  Has not throughout hospital acknowledged mania. Discussing patient identified problems/goals with staff:  Yes.  Fully interactive re all issues. Medical problems stabilized or resolved:  As evidenced by:  talking through all issues as they come up Denies suicidal/homicidal ideation: Yes. and As evidenced by:  self-report Issues/concerns per patient self-inventory:  No. Other:  New problem(s) identified: No, Describe:  Patient is stable for discharge  Reason for Continuation of Hospitalization: Other; describe Patient to discharge today  Interventions implemented related to continuation of hospitalization:  Not applicable as patient is discharging today  Additional comments:  N/A  Estimated length of stay:  D/C today  Discharge Plan:  Go home with Mother, who will pick up.  Go to [facility] of the [facility] 01/27/11 at 1:00 for therapy with Will Lovey Newcomer and 02/08/11 at 1:30 for medication management with [facility].  Advised patient to go to [facility] throughout week for supports and education.  New goal(s):  Not applicable  Review of initial/current patient goals per problem list:   1.  Goal(s):  Reduce mania to baseline  Met:  Yes  Target date:  By Discharge   As evidenced by:  Patient remains calm and appropriate at all times, is able to interact appropriately without  interruptions and intrusions  2.  Goal (s):  Reduce delusions to baseline.  Met:  Yes  Target date:  By Discharge   As evidenced by:  Patient still believes he used to be in the [employer]; this is a delusion he had during period of time prior to admission even when well.  Other delusions seem to be in remission.  3.  Goal(s):  Increase sleep to adequate level, regularly.  Met:  Yes  Target date:  By Discharge   As evidenced by:  Self-report of good sleep   Attendees: Patient:  Andrew Kaiser 11/8/20129:23 AM  Family:   11/8/20129:23 AM  Physician:  Dr. Gwenyth Bouillon Zaide Mcclenahan 11/8/20129:23 AM  Nursing:   Izola Price, RN, BSN 11/8/20129:23 AM  Case Manager:  Sarina Ser, LCSW 11/8/20129:23 AM  Counselor:  Veto Kemps, MT-BC 11/8/20129:23 AM  Other:  Lorinda Creed, NP student 11/8/20129:23 AM  Other:  Wilmon Arms, counseling intern 11/8/20129:23 AM  Other:   11/8/20129:23 AM  Other:   11/8/20129:23 AM   Scribe for Treatment Team:   Sarina Ser, 01/21/2011, 9:23 AM  2

## 2011-01-21 NOTE — Progress Notes (Signed)
Suicide Risk Assessment  Discharge Assessment     Demographic factors:    Current Mental Status:    Risk Reduction Factors:     CLINICAL FACTORS:  Bipolar disorder under current admission.  COGNITIVE FEATURES THAT CONTRIBUTE TO RISK:  None  SUICIDE RISK:   Minimal: No identifiable suicidal ideation.  Patients presenting with no risk factors but with morbid ruminations; may be classified as minimal risk based on the severity of the depressive symptoms  PLAN OF CARE:  Kebin Maye S 01/21/2011, 1:49 PM
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
## 2011-01-25 NOTE — Progress Notes (Signed)
Patient Discharge Instructions:  Dictated admission note faxed, Date faxed:  01/22/11 D/C instructions faxed, Date faxed:  01/22/11 D/C Summary faxed, Date faxed:  01/22/11 Med. Rec. Form faxed, Date faxed:  01/22/11  Lolita Cram nita, 01/25/2011, 10:47 AM

## 2011-02-03 ENCOUNTER — Encounter: Payer: Self-pay | Admitting: Emergency Medicine

## 2011-02-03 ENCOUNTER — Emergency Department (HOSPITAL_COMMUNITY): Payer: Medicaid Other

## 2011-02-03 ENCOUNTER — Emergency Department (HOSPITAL_COMMUNITY)
Admission: EM | Admit: 2011-02-03 | Discharge: 2011-02-03 | Disposition: A | Discharge status: Home / Self Care | Payer: Medicaid Other | Attending: Emergency Medicine | Admitting: Emergency Medicine

## 2011-02-03 DIAGNOSIS — Z8673 Personal history of transient ischemic attack (TIA), and cerebral infarction without residual deficits: Secondary | ICD-10-CM | POA: Insufficient documentation

## 2011-02-03 DIAGNOSIS — S52209A Unspecified fracture of shaft of unspecified ulna, initial encounter for closed fracture: Secondary | ICD-10-CM

## 2011-02-03 DIAGNOSIS — Z79899 Other long term (current) drug therapy: Secondary | ICD-10-CM | POA: Insufficient documentation

## 2011-02-03 DIAGNOSIS — S5290XD Unspecified fracture of unspecified forearm, subsequent encounter for closed fracture with routine healing: Secondary | ICD-10-CM | POA: Insufficient documentation

## 2011-02-03 HISTORY — DX: Cerebral infarction, unspecified: I63.9

## 2011-02-03 MED ORDER — IBUPROFEN 800 MG PO TABS
800.0000 mg | ORAL_TABLET | Freq: Once | ORAL | Status: AC
  Administered 2011-02-03: 800 mg via ORAL
  Filled 2011-02-03: qty 1

## 2011-02-03 MED ORDER — IBUPROFEN 800 MG PO TABS: 800.0000 mg | ORAL_TABLET | Freq: Three times a day (TID) | ORAL | Status: AC
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
## 2011-02-03 NOTE — ED Provider Notes (Signed)
Medical screening examination/treatment/procedure(s) were performed by non-physician practitioner and as supervising physician I was immediately available for consultation/collaboration.  [name] Sou, MD 02/03/11 1729

## 2012-11-27 ENCOUNTER — Emergency Department (HOSPITAL_COMMUNITY): Payer: Medicaid Other

## 2012-11-27 ENCOUNTER — Encounter (HOSPITAL_COMMUNITY): Payer: Self-pay | Admitting: *Deleted

## 2012-11-27 ENCOUNTER — Emergency Department (HOSPITAL_COMMUNITY)
Admission: EM | Admit: 2012-11-27 | Discharge: 2012-11-27 | Disposition: A | Discharge status: Home / Self Care | Payer: Medicaid Other | Attending: Emergency Medicine | Admitting: Emergency Medicine

## 2012-11-27 DIAGNOSIS — F3112 Bipolar disorder, current episode manic without psychotic features, moderate: Secondary | ICD-10-CM | POA: Insufficient documentation

## 2012-11-27 DIAGNOSIS — Z8673 Personal history of transient ischemic attack (TIA), and cerebral infarction without residual deficits: Secondary | ICD-10-CM | POA: Insufficient documentation

## 2012-11-27 DIAGNOSIS — Z79899 Other long term (current) drug therapy: Secondary | ICD-10-CM | POA: Insufficient documentation

## 2012-11-27 DIAGNOSIS — R079 Chest pain, unspecified: Secondary | ICD-10-CM | POA: Insufficient documentation

## 2012-11-27 LAB — CBC
Hemoglobin: 14.4 g/dL (ref 13.0–17.0)
MCHC: 34.1 g/dL (ref 30.0–36.0)
Platelets: 244 10*3/uL (ref 150–400)

## 2012-11-27 LAB — BASIC METABOLIC PANEL
GFR calc Af Amer: >90 mL/min (ref >90)
GFR calc non Af Amer: >90 mL/min (ref >90)
Glucose, Bld: 100 mg/dL — ABNORMAL HIGH (ref 70–99)
Potassium: 4.1 mEq/L (ref 3.5–5.1)
Sodium: 137 mEq/L (ref 135–145)

## 2012-11-27 LAB — POCT I-STAT TROPONIN I: Troponin i, poc: 0 ng/mL (ref 0.00–0.08)

## 2012-11-27 LAB — ETHANOL: Alcohol, Ethyl (B): <11 mg/dL (ref 0–11)

## 2012-11-27 MED ORDER — HYDROXYZINE PAMOATE 25 MG PO CAPS
25.0000 mg | ORAL_CAPSULE | Freq: Three times a day (TID) | ORAL | Status: DC | PRN
  Filled 2012-11-27: qty 1

## 2012-11-27 MED ORDER — ZOLPIDEM TARTRATE 5 MG PO TABS: 10.0000 mg | ORAL_TABLET | Freq: Every evening | ORAL | Status: DC | PRN

## 2012-11-27 MED ORDER — LORAZEPAM 1 MG PO TABS: 1.0000 mg | ORAL_TABLET | Freq: Three times a day (TID) | ORAL | Status: DC | PRN

## 2012-11-27 MED ORDER — ACETAMINOPHEN 325 MG PO TABS: 650.0000 mg | ORAL_TABLET | ORAL | Status: DC | PRN

## 2012-11-27 MED ORDER — IBUPROFEN 200 MG PO TABS: 600.0000 mg | ORAL_TABLET | Freq: Three times a day (TID) | ORAL | Status: DC | PRN

## 2012-11-27 MED ORDER — TRAZODONE HCL 100 MG PO TABS: 100.0000 mg | ORAL_TABLET | Freq: Every day | ORAL | Status: DC

## 2012-11-27 MED ORDER — HYDROXYZINE HCL 25 MG PO TABS
25.0000 mg | ORAL_TABLET | Freq: Three times a day (TID) | ORAL | Status: DC | PRN
  Filled 2012-11-27: qty 1

## 2012-11-27 MED ORDER — ADULT MULTIVITAMIN W/MINERALS CH
1.0000 | ORAL_TABLET | Freq: Every day | ORAL | Status: DC
  Administered 2012-11-27: 1 via ORAL
  Filled 2012-11-27: qty 1

## 2012-11-27 MED ORDER — LAMOTRIGINE 25 MG PO TABS
50.0000 mg | ORAL_TABLET | Freq: Every day | ORAL | Status: DC
  Administered 2012-11-27: 50 mg via ORAL
  Filled 2012-11-27: qty 2

## 2012-11-27 MED ORDER — RISPERIDONE 2 MG PO TABS: 3.0000 mg | ORAL_TABLET | Freq: Every day | ORAL | Status: DC

## 2012-11-27 NOTE — ED Notes (Signed)
Pt hx of stroke. Pt reports central chest pain x2 weeks. 7/10. Reports he has not been sleepy well either. Denies SOB. Denies N/V. Reports diarrhea.

## 2012-11-27 NOTE — ED Notes (Signed)
Patient transported to X-ray 

## 2012-11-27 NOTE — ED Notes (Signed)
Pt has been off of bipolar medications x 2 weeks

## 2012-11-27 NOTE — BH Assessment (Signed)
Assessment Note  Andrew Kaiser is an 52 y.o. male Presents voluntarily to [facility] accompanied by his mother. Pt is oriented x's 4, alert, calm and cooperative. Pt denies SI, HI, AVH, Delusions or Psychosis. Pt reports that he has stopped taking his medication because "a friend comes around and knows that my medication makes me sleep, so I don't take it". Pt agrees that it is not wise to stop taking his medication and place his mental wellness in jeopardy. Pt agrees with the plan to restart his medications today, keep his appointment with Serina Cowper of [facility] of the [place] on Wednesday 11/29/12 and to contact his psychiatrist at [facility] to discuss his medications; whether there needs to be an adjustment. Pt does not meet criteria for inpatient treatment. Denice Bors, [facility] 11/27/2012 11:16 PM   Axis I: Bipolar, Depressed Axis II: Deferred Axis III:  Past Medical History  Diagnosis Date  . Stroke    Axis IV: economic problems, housing problems, occupational problems, other psychosocial or environmental problems, problems related to social environment and problems with primary support group Axis V: 51-60 moderate symptoms  Past Medical History:  Past Medical History  Diagnosis Date  . Stroke     Past Surgical History  Procedure Laterality Date  . Rhinoplasty      Family History: History reviewed. No pertinent family history.  Social History:  reports that he has never smoked. He does not have any smokeless tobacco history on file. He reports that he drinks about 0.6 ounces of alcohol per week. His drug history is not on file.  Additional Social History:  Alcohol / Drug Use Pain Medications: pt denies Prescriptions: pt denies Over the Counter: pt denies History of alcohol / drug use?: Yes Substance #1 Name of Substance 1: etoh 1 - Age of First Use: 19 1 - Duration: 23 yrs 1 - Last Use / Amount: 9/13 Substance #2 Name of Substance 2: cannabis 2 - Age of First  Use: 20 2 - Duration: 22 yrs 2 - Last Use / Amount: 1 yr ago  CIWA: CIWA-Ar BP: 113/75 mmHg Pulse Rate: 79 COWS:    Allergies: No Known Allergies  Home Medications:  (Not in a hospital admission)  OB/GYN Status:  No LMP for male patient.  General Assessment Data [facility] of Assessment: [facility] Is this a Tele or Face-to-Face Assessment?: Face-to-Face Is this an Initial Assessment or a Re-assessment for this encounter?: Initial Assessment Living Arrangements: Parent (mom) Can pt return to current living arrangement?: Yes Admission Status: Voluntary Is patient capable of signing voluntary admission?: Yes Transfer from: Home Referral Source: Self/Family/Friend  Medical Screening Exam [facility] Walk-in ONLY) Medical Exam completed: Yes  [facility] Crisis Care Plan Living Arrangements: Parent (mom) Name of Psychiatrist:  (Dr. Varney Daily) Name of Therapist:  Serina Cowper)  Education Status Is patient currently in school?: No  Risk to self Suicidal Ideation: No Suicidal Intent: No Is patient at risk for suicide?: No Suicidal Plan?: No Access to Means: No What has been your use of drugs/alcohol within the last 12 months?:  (etoh) Previous Attempts/Gestures: No Other Self Harm Risks:  (none noted) Triggers for Past Attempts: None known Intentional Self Injurious Behavior: None Family Suicide History: No Recent stressful life event(s): Financial Problems;Job Loss Persecutory voices/beliefs?: No Depression: Yes Depression Symptoms: Tearfulness;Insomnia;Isolating;Fatigue;Guilt;Feeling worthless/self pity;Feeling angry/irritable (worthless and irritable) Substance abuse history and/or treatment for substance abuse?: Yes Suicide prevention information given to non-admitted patients: Not applicable  Risk to Others Homicidal Ideation: No Thoughts of  Harm to Others: No Current Homicidal Intent: No Current Homicidal Plan: No Access to Homicidal Means: No History of harm to others?:  No Assessment of Violence: None Noted Does patient have access to weapons?: No Criminal Charges Pending?: No Does patient have a court date: No  Psychosis Hallucinations: None noted Delusions: None noted  Mental Status Report Appear/Hygiene:  (hospital scrubs) Eye Contact: Good Motor Activity: Freedom of movement Speech: Logical/coherent Level of Consciousness: Alert Mood: Depressed Affect: Appropriate to circumstance Anxiety Level: Severe Thought Processes: Coherent;Relevant Judgement: Unimpaired Orientation: Person;Place;Time;Situation;Appropriate for developmental age Obsessive Compulsive Thoughts/Behaviors: None  Cognitive Functioning Concentration: Decreased Memory: Recent Intact;Remote Intact IQ: Average Insight: Fair Impulse Control: Fair Appetite: Good Weight Loss:  (0) Weight Gain:  (0) Sleep: Decreased Total Hours of Sleep:  (3-4/24) Vegetative Symptoms: None  ADLScreening [facility] Assessment Services) Patient's cognitive ability adequate to safely complete daily activities?: Yes Patient able to express need for assistance with ADLs?: Yes Independently performs ADLs?: Yes (appropriate for developmental age)  Prior Inpatient Therapy Prior Inpatient Therapy: Yes Prior Therapy Dates:  (2012) Prior Therapy Facilty/Provider(s):  ([facility]) Reason for Treatment:  (prior to bi-polar dx, behavioral)  Prior Outpatient Therapy Prior Outpatient Therapy: Yes Prior Therapy Dates:  (2012 to current) Prior Therapy Facilty/Provider(s):  ([facility]) Reason for Treatment:  (Bi-Polar DO)  ADL Screening (condition at time of admission) Patient's cognitive ability adequate to safely complete daily activities?: Yes Is the patient deaf or have difficulty hearing?: No Does the patient have difficulty seeing, even when wearing glasses/contacts?: No Does the patient have difficulty concentrating, remembering, or making decisions?: Yes Patient able to express need  for assistance with ADLs?: Yes Does the patient have difficulty dressing or bathing?: No Independently performs ADLs?: Yes (appropriate for developmental age) Does the patient have difficulty walking or climbing stairs?: No Weakness of Legs: None Weakness of Arms/Hands: None  Home Assistive Devices/Equipment Home Assistive Devices/Equipment: None    Abuse/Neglect Assessment (Assessment to be complete while patient is alone) Physical Abuse: Denies Verbal Abuse: Denies Sexual Abuse: Denies Exploitation of patient/patient's resources: Denies Self-Neglect: Denies Values / Beliefs Cultural Requests During Hospitalization: None Spiritual Requests During Hospitalization: None Consults Spiritual Care Consult Needed: No Social Work Consult Needed: No Merchant navy officer (For Healthcare) Advance Directive: Patient does not have advance directive Nutrition Screen- [facility] Adult/[facility] Patient's home diet: Regular  Additional Information 1:1 In Past 12 Months?: No CIRT Risk: No Elopement Risk: No Does patient have medical clearance?: Yes     Disposition: pt is referred back to his [facility] therapist and psychiatrist and to restart his mediation. Disposition Initial Assessment Completed for this Encounter: Yes Disposition of Patient: Other dispositions Other disposition(s): To current provider  On Site Evaluation by:   Reviewed with Physician:    Manual Meier 11/27/2012 11:09 PM

## 2012-11-27 NOTE — ED Notes (Addendum)
Patient has been wainded. Patient belongings sent with his mother.

## 2012-11-27 NOTE — ED Provider Notes (Addendum)
CSN: [id number]     Arrival date & time 11/27/12  1415 History   First MD Initiated Contact with Patient 11/27/12 1537     Chief Complaint  Patient presents with  . Chest Pain  . Medical Clearance   (Consider location/radiation/quality/duration/timing/severity/associated sxs/prior Treatment) The history is provided by the patient.  BUD KAESER is a 52 y.o. male hx of stroke, bipolar here with chest pain and sleep deprivation. He states that he took himself off of his bipolar medications about 2 weeks ago and has not been sleeping well. Whenever he goes to sleep he has some racing thoughts and is unable to sleep. He has intermittent left-sided chest pain for the last 2 weeks. Not worse with exertion and not pleuritic and not worse with movement. No history of CAD or cardiac stents in the past but he does have a history of stroke in the past due to PDA. Denies depression or hallucination. Denies drug use.    Past Medical History  Diagnosis Date  . Stroke    Past Surgical History  Procedure Laterality Date  . Rhinoplasty     History reviewed. No pertinent family history. History  Substance Use Topics  . Smoking status: Never Smoker   . Smokeless tobacco: Not on file  . Alcohol Use: 0.6 oz/week    1 Cans of beer per week    Review of Systems  Cardiovascular: Positive for chest pain.  Psychiatric/Behavioral: Positive for sleep disturbance. The patient is hyperactive.   All other systems reviewed and are negative.    Allergies  Review of patient's allergies indicates no known allergies.  Home Medications   Current Outpatient Rx  Name  Route  Sig  Dispense  Refill  . hydrOXYzine (VISTARIL) 25 MG capsule   Oral   Take 25 mg by mouth 3 (three) times daily as needed for itching.         . lamoTRIgine (LAMICTAL) 25 MG tablet   Oral   Take 50 mg by mouth daily.         . Multiple Vitamin (MULTIVITAMIN WITH MINERALS) TABS tablet   Oral   Take 1 tablet by mouth  daily.         . risperiDONE (RISPERDAL) 3 MG tablet   Oral   Take 3 mg by mouth at bedtime.         . traZODone (DESYREL) 100 MG tablet   Oral   Take 100 mg by mouth at bedtime.         Marland Kitchen zolpidem (AMBIEN) 10 MG tablet   Oral   Take 10 mg by mouth at bedtime as needed. For insomnia          . EXPIRED: risperiDONE (RISPERDAL) 3 MG tablet   Oral   Take 1 tablet (3 mg total) by mouth at bedtime.   15 tablet   0    BP 113/75  Pulse 79  Temp(Src) 98.4 F (36.9 C) (Oral)  Resp 16  SpO2 97% Physical Exam  Nursing note and vitals reviewed. Constitutional: He is oriented to person, place, and time. He appears well-developed and well-nourished.  Guarded   HENT:  Head: Normocephalic.  Mouth/Throat: Oropharynx is clear and moist.  Eyes: Conjunctivae are normal. Pupils are equal, round, and reactive to light.  Neck: Normal range of motion. Neck supple.  Cardiovascular: Normal rate, regular rhythm and normal heart sounds.   Pulmonary/Chest: Effort normal and breath sounds normal. No respiratory distress. He has no wheezes. He  has no rales. He exhibits no tenderness.  Abdominal: Soft. Bowel sounds are normal. He exhibits no distension. There is no tenderness. There is no rebound.  Musculoskeletal: Normal range of motion.  Neurological: He is alert and oriented to person, place, and time.  Skin: Skin is warm and dry.  Psychiatric:  Guarded, doesn't want to answer many questions. Slightly depressed.     ED Course  Procedures (including critical care time) Labs Review Labs Reviewed  BASIC METABOLIC PANEL - Abnormal; Notable for the following:    Glucose, Bld 100 (*)    All other components within normal limits  CBC  ETHANOL  URINE RAPID DRUG SCREEN (HOSP PERFORMED)  POCT I-STAT TROPONIN I   Imaging Review Dg Chest 2 View  11/27/2012   CLINICAL DATA:  Chest pain.  EXAM: CHEST - 2 VIEW  COMPARISON:  01/13/2011  FINDINGS: The heart size and mediastinal contours are  within normal limits. There is a stable appearance to an occluder device located at the level of the atrial septum. Both lungs are clear. No edema, infiltrate, nodule, pneumothorax or pleural fluid is identified. The visualized skeletal structures are unremarkable.  IMPRESSION: No active disease.   Electronically Signed   By: Irish Lack   On: 11/27/2012 16:05     Date: 11/27/2012  Rate: 74  Rhythm: normal sinus rhythm  QRS Axis: normal  Intervals: normal  ST/T Wave abnormalities: normal  Conduction Disutrbances:none  Narrative Interpretation:   Old EKG Reviewed: unchanged    MDM  No diagnosis found. Andrew Kaiser is a 52 y.o. male here with chest pain. Chest pain intermittent for 2 weeks and no hx of CAD and trop neg x 1 so I doubt ACS. Chest pain likely from lack of sleep. I think sleep disturbance is likely from mania from not taking his bipolar meds. I contacted psych to evaluate.   4:53 PM Medically cleared. Will transfer to psych holding area.   5:27 PM ACT team evaluated patient, doesn't qualify for inpatient psych admission. Recommend restarting psych meds and f/u with [facility].    Richardean Canal, MD 11/27/12 1655  Richardean Canal, MD 11/27/12 1728

## 2013-05-17 ENCOUNTER — Encounter: Payer: Self-pay | Admitting: Gastroenterology

## 2013-06-13 ENCOUNTER — Ambulatory Visit (AMBULATORY_SURGERY_CENTER): Payer: Self-pay

## 2013-06-13 VITALS — Ht 72.9 in | Wt 225.0 lb

## 2013-06-13 DIAGNOSIS — Z1211 Encounter for screening for malignant neoplasm of colon: Secondary | ICD-10-CM

## 2013-06-26 ENCOUNTER — Encounter: Payer: Self-pay | Admitting: Gastroenterology

## 2013-06-26 ENCOUNTER — Ambulatory Visit (AMBULATORY_SURGERY_CENTER): Payer: Medicaid Other | Admitting: Gastroenterology

## 2013-06-26 VITALS — BP 122/78 | HR 76 | Temp 97.0°F | Resp 20 | Ht 72.0 in | Wt 225.0 lb

## 2013-06-26 DIAGNOSIS — Z1211 Encounter for screening for malignant neoplasm of colon: Secondary | ICD-10-CM

## 2013-06-26 DIAGNOSIS — D126 Benign neoplasm of colon, unspecified: Secondary | ICD-10-CM

## 2013-06-26 MED ORDER — SODIUM CHLORIDE 0.9 % IV SOLN: 500.0000 mL | INTRAVENOUS | Status: DC

## 2013-06-26 NOTE — Progress Notes (Signed)
Called to room to assist during endoscopic procedure.  Patient ID and intended procedure confirmed with present staff. Received instructions for my participation in the procedure from the performing physician.  

## 2013-06-26 NOTE — Patient Instructions (Signed)
YOU HAD AN ENDOSCOPIC PROCEDURE TODAY AT THE [facility]: Refer to the procedure report that was given to you for any specific questions about what was found during the examination.  If the procedure report does not answer your questions, please call your gastroenterologist to clarify.  If you requested that your care partner not be given the details of your procedure findings, then the procedure report has been included in a sealed envelope for you to review at your convenience later.  YOU SHOULD EXPECT: Some feelings of bloating in the abdomen. Passage of more gas than usual.  Walking can help get rid of the air that was put into your GI tract during the procedure and reduce the bloating. If you had a lower endoscopy (such as a colonoscopy or flexible sigmoidoscopy) you may notice spotting of blood in your stool or on the toilet paper. If you underwent a bowel prep for your procedure, then you may not have a normal bowel movement for a few days.  DIET: Your first meal following the procedure should be a light meal and then it is ok to progress to your normal diet.  A half-sandwich or bowl of soup is an example of a good first meal.  Heavy or fried foods are harder to digest and may make you feel nauseous or bloated.  Likewise meals heavy in dairy and vegetables can cause extra gas to form and this can also increase the bloating.  Drink plenty of fluids but you should avoid alcoholic beverages for 24 hours.  ACTIVITY: Your care partner should take you home directly after the procedure.  You should plan to take it easy, moving slowly for the rest of the day.  You can resume normal activity the day after the procedure however you should NOT DRIVE or use heavy machinery for 24 hours (because of the sedation medicines used during the test).    SYMPTOMS TO REPORT IMMEDIATELY: A gastroenterologist can be reached at any hour.  During normal business hours, 8:30 AM to 5:00 PM Monday through Friday,  call [phone].  After hours and on weekends, please call the GI answering service at [phone] who will take a message and have the physician on call contact you.   Following lower endoscopy (colonoscopy or flexible sigmoidoscopy):  Excessive amounts of blood in the stool  Significant tenderness or worsening of abdominal pains  Swelling of the abdomen that is new, acute  Fever of 100F or higher  FOLLOW UP: If any biopsies were taken you will be contacted by phone or by letter within the next 1-3 weeks.  Call your gastroenterologist if you have not heard about the biopsies in 3 weeks.  Our staff will call the home number listed on your records the next business day following your procedure to check on you and address any questions or concerns that you may have at that time regarding the information given to you following your procedure. This is a courtesy call and so if there is no answer at the home number and we have not heard from you through the emergency physician on call, we will assume that you have returned to your regular daily activities without incident.  SIGNATURES/CONFIDENTIALITY: You and/or your care partner have signed paperwork which will be entered into your electronic medical record.  These signatures attest to the fact that that the information above on your After Visit Summary has been reviewed and is understood.  Full responsibility of the confidentiality of this   discharge information lies with you and/or your care-partner.  Read your handout about polyps.    

## 2013-06-26 NOTE — Progress Notes (Signed)
Lidocaine-40mg IV prior to Propofol InductionPropofol given over incremental dosages 

## 2013-06-26 NOTE — Op Note (Addendum)
[place]  [address] [place], [id number]   COLONOSCOPY PROCEDURE REPORT  PATIENT: Andrew Kaiser, Andrew Kaiser  MR#: [id number] BIRTHDATE: 11-Mar-1961 , 33  yrs. old GENDER: Male ENDOSCOPIST: Inda Castle, MD REFERRED AJ:GOTLX Jeanie Cooks, M.D. PROCEDURE DATE:  06/26/2013 PROCEDURE:   Colonoscopy with snare polypectomy and Colonoscopy with cold biopsy polypectomy First Screening Colonoscopy - Avg.  risk and is 50 yrs.  old or older Yes.  Prior Negative Screening - Now for repeat screening. N/A  History of Adenoma - Now for follow-up colonoscopy & has been > or = to 3 yrs.  N/A  Polyps Removed Today? Yes. ASA CLASS:   Class II INDICATIONS:average risk screening. MEDICATIONS: MAC sedation, administered by CRNA and propofol (Diprivan) 200mg  IV  DESCRIPTION OF PROCEDURE:   After the risks benefits and alternatives of the procedure were thoroughly explained, informed consent was obtained.  A digital rectal exam revealed no abnormalities of the rectum.   The LB [id number]  endoscope was introduced through the anus and advanced to the cecum, which was identified by both the appendix and ileocecal valve. No adverse events experienced.   The quality of the prep was Suprep good  The instrument was then slowly withdrawn as the colon was fully examined.      COLON FINDINGS: A sessile polyp measuring 5 mm in size was found in the ascending colon.  A polypectomy was performed.  The resection was complete and the polyp tissue was completely retrieved.   A sessile polyp measuring 2 mm in size was found at the cecum.  A polypectomy was performed with cold forceps.   The colon was otherwise normal.  There was no diverticulosis, inflammation, polyps or cancers unless previously stated.  Retroflexed views revealed no abnormalities. The time to cecum=3 minutes 20 seconds. Withdrawal time=8 minutes 54 seconds.  The scope was withdrawn and the procedure  completed. COMPLICATIONS: There were no complications.  ENDOSCOPIC IMPRESSION: 1.   Sessile polyp measuring 5 mm in size was found in the ascending colon; polypectomy was performed 2.   Sessile polyp measuring 2 mm in size was found at the cecum; polypectomy was performed with cold forceps 3.   The colon was otherwise normal  RECOMMENDATIONS: If the polyp(s) removed today are proven to be adenomatous (pre-cancerous) polyps, you will need a repeat colonoscopy in 5 years.  Otherwise you should continue to follow colorectal cancer screening guidelines for "routine risk" patients with colonoscopy in 10 years.  You will receive a letter within 1-2 weeks with the results of your biopsy as well as final recommendations.  Please call my office if you have not received a letter after 3 weeks.   eSigned:  Inda Castle, MD 06/26/2013 3:26 PM   cc:   PATIENT NAME:  Andrew Kaiser, Andrew Kaiser MR#: [id number]

## 2013-06-27 ENCOUNTER — Telehealth: Payer: Self-pay | Admitting: *Deleted

## 2013-06-27 NOTE — Telephone Encounter (Signed)
Message left

## 2013-07-02 ENCOUNTER — Encounter: Payer: Self-pay | Admitting: Gastroenterology

## 2014-10-07 ENCOUNTER — Ambulatory Visit (INDEPENDENT_AMBULATORY_CARE_PROVIDER_SITE_OTHER): Payer: PRIVATE HEALTH INSURANCE | Admitting: Neurology

## 2014-10-07 ENCOUNTER — Encounter: Payer: Self-pay | Admitting: Neurology

## 2014-10-07 VITALS — BP 110/72 | HR 60 | Ht 72.0 in | Wt 204.8 lb

## 2014-10-07 DIAGNOSIS — I679 Cerebrovascular disease, unspecified: Secondary | ICD-10-CM | POA: Diagnosis not present

## 2014-10-07 DIAGNOSIS — R413 Other amnesia: Secondary | ICD-10-CM | POA: Diagnosis not present

## 2014-10-07 HISTORY — DX: Other amnesia: R41.3

## 2014-10-07 NOTE — Patient Instructions (Signed)
We will try to get you set up for some formal neuropsychological evaluation to fully assess memory and cognitive issues. I will try to contact you once the evaluation has been done.

## 2014-10-07 NOTE — Progress Notes (Signed)
Reason for visit: Memory disturbance  Referring physician: Vocational rehabilitation  Andrew Kaiser is a 54 y.o. male  History of present illness:  Andrew Kaiser is a 54 year old left-handed black male with a history of cerebrovascular disease. He indicates that about 10 years ago, he sustained strokes involving the right frontal and left anterior temporal area. The strokes apparently were associated with a patent foramen ovale that required closure. The patient indicates that he continues on aspirin therapy, and he has not sustained further stroke events. Since the strokes, he has had a change in his cognitive capacity, with some forgetfulness that has been persistent. As time has gone on, he has learned to cope with the memory issues to some degree by taking better notes and getting better organized. 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 He has been diagnosed with bipolar disorder, and he is on medications for this. He has tried to get disability in the past, this apparently was denied, and he is now trying to get back into the workforce. He indicates that he did have formal neuropsychological testing about 8 years ago, but not since. He does report some insomnia issues on occasion, averaging once or twice a week. He takes Ambien if needed for this. He denies any headache, residual weakness of the extremities, but he does report some numbness of the hands. The patient denies any balance issues. He indicates that he lives with his mother, the mother pays all the bills, and the patient is able to keep up with his medications for the most part. He comes to this office for an evaluation.  Past Medical History  Diagnosis Date  . Stroke   . Allergy   . Anxiety   . Depression   . PFO (patent foramen ovale)   . Memory disturbance 10/07/2014    Past Surgical History  Procedure Laterality Date  . Rhinoplasty    . Patent foramen ovale closure  2006    Family  History  Problem Relation Age of Onset  . Colon cancer Neg Hx   . Pancreatic cancer Neg Hx   . Rectal cancer Neg Hx   . Stomach cancer Neg Hx   . Esophageal cancer Neg Hx   . Prostate cancer Neg Hx   . Stroke Mother   . Heart disease Father   . Asthma Father   . Stroke Sister   . Multiple sclerosis Sister     Social history:  reports that he has quit smoking. His smoking use included Cigars. He has never used smokeless tobacco. He reports that he drinks alcohol. He reports that he does not use illicit drugs.  Medications:  Prior to Admission medications   Medication Sig Start Date End Date Taking? Authorizing Provider  aspirin 325 MG tablet Take 325 mg by mouth daily.   Yes Historical Provider, MD  cetirizine (ZYRTEC) 10 MG tablet Take 10 mg by mouth daily.   Yes Historical Provider, MD  Fe Asp [id number]-[id number]-BS (MULTIGEN FOLIC) 96-283-6-6 MG TABS Take 1 tablet by mouth daily.   Yes Historical Provider, MD  fluticasone (FLONASE) 50 MCG/ACT nasal spray Place 2 sprays into both nostrils daily.   Yes Historical Provider, MD  hydrOXYzine (VISTARIL) 25 MG capsule Take 25 mg by mouth 3 (three) times daily as needed for itching.   Yes Historical Provider, MD  lamoTRIgine (LAMICTAL) 100 MG tablet Take 100 mg by mouth daily.   Yes Historical Provider, MD  Multiple Vitamin (MULTIVITAMIN WITH MINERALS) TABS tablet Take 1 tablet by mouth daily.   Yes Historical Provider, MD  risperiDONE (RISPERDAL) 3 MG tablet Take 3 mg by mouth at bedtime.   Yes Historical Provider, MD  traZODone (DESYREL) 100 MG tablet Take 100 mg by mouth at bedtime.   Yes Historical Provider, MD  zolpidem (AMBIEN) 10 MG tablet Take 10 mg by mouth at bedtime as needed. For insomnia    Yes Historical Provider, MD     No Known Allergies  ROS:  Out of a complete 14 system review of symptoms, the patient complains only of the following symptoms, and all other reviewed systems are negative.  Memory loss Depression,  anxiety, racing thoughts Insomnia  Blood pressure 110/72, pulse 60, height 6' (1.829 m), weight 204 lb 12.8 oz (92.897 kg).  Physical Exam  General: The patient is alert and cooperative at the time of the examination.  Eyes: Pupils are equal, round, and reactive to light. Discs are flat bilaterally.  Neck: The neck is supple, no carotid bruits are noted.  Respiratory: The respiratory examination is clear.  Cardiovascular: The cardiovascular examination reveals a regular rate and rhythm, no obvious murmurs or rubs are noted.  Skin: Extremities are without significant edema.  Neurologic Exam  Mental status: The patient is alert and oriented x 3 at the time of the examination. The patient has apparent normal recent and remote memory, with an apparently normal attention span and concentration ability. Mini-Mental Status Examination done today shows a total score of 29/30. The patient is able to name 19 animals in 30 seconds.  Cranial nerves: Facial symmetry is present. There is good sensation of the face to pinprick and soft touch bilaterally. The strength of the facial muscles and the muscles to head turning and shoulder shrug are normal bilaterally. Speech is well enunciated, no aphasia or dysarthria is noted. Extraocular movements are full. Visual fields are full. The tongue is midline, and the patient has symmetric elevation of the soft palate. No obvious hearing deficits are noted.  Motor: The motor testing reveals 5 over 5 strength of all 4 extremities. Good symmetric motor tone is noted throughout.  Sensory: Sensory testing is intact to pinprick, soft touch, vibration sensation, and position sense on all 4 extremities. No evidence of extinction is noted.  Coordination: Cerebellar testing reveals good finger-nose-finger and heel-to-shin bilaterally.  Gait and station: Gait is normal. Tandem gait is normal. Romberg is negative. No drift is seen.  Reflexes: Deep tendon reflexes are  symmetric, but are brisk bilaterally. Toes are downgoing bilaterally.   CT head 01/13/2011:  Findings: Chronic infarct right frontal lobe and left frontal temporal lobe. No acute infarct. Negative for hemorrhage or mass. Ventricle size is normal.  Chronic sinusitis especially left ethmoid and left frontal sinuses.  IMPRESSION: Chronic ischemic changes. No acute intracranial abnormality.  Chronic sinusitis.  * CT scan images were reviewed online. I agree with the written report.    Assessment/Plan:  1. Cerebrovascular disease, left and temporal and right frontal strokes  2. Memory disturbance  3. Bipolar disorder  The patient will be sent for formal neuropsychological evaluation to reassess his cognitive issues at this time. 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 The patient should be able to maintain gainful employment of some sort. He will follow-up through this office on an as-needed basis.  Vocational rehabilitation telephone  number is [phone]. Fax number is [phone].  Jill Alexanders MD 10/07/2014 7:19 PM  [facility] [address] [place], [place] [address]  Phone [phone] Fax [phone]

## 2014-10-08 ENCOUNTER — Telehealth: Payer: Self-pay

## 2014-10-08 NOTE — Telephone Encounter (Signed)
Office note faxed to vocational rehab [phone]). Confirmation received.

## 2014-10-29 ENCOUNTER — Emergency Department (HOSPITAL_COMMUNITY)
Admission: EM | Admit: 2014-10-29 | Discharge: 2014-10-30 | Disposition: A | Discharge status: Home / Self Care | Payer: Medicaid Other | Attending: Emergency Medicine | Admitting: Emergency Medicine

## 2014-10-29 ENCOUNTER — Encounter (HOSPITAL_COMMUNITY): Payer: Self-pay | Admitting: Emergency Medicine

## 2014-10-29 DIAGNOSIS — Z87891 Personal history of nicotine dependence: Secondary | ICD-10-CM | POA: Insufficient documentation

## 2014-10-29 DIAGNOSIS — F329 Major depressive disorder, single episode, unspecified: Secondary | ICD-10-CM | POA: Diagnosis not present

## 2014-10-29 DIAGNOSIS — R2243 Localized swelling, mass and lump, lower limb, bilateral: Secondary | ICD-10-CM | POA: Insufficient documentation

## 2014-10-29 DIAGNOSIS — F419 Anxiety disorder, unspecified: Secondary | ICD-10-CM | POA: Diagnosis not present

## 2014-10-29 DIAGNOSIS — R609 Edema, unspecified: Secondary | ICD-10-CM

## 2014-10-29 DIAGNOSIS — Z79899 Other long term (current) drug therapy: Secondary | ICD-10-CM | POA: Insufficient documentation

## 2014-10-29 DIAGNOSIS — Z8673 Personal history of transient ischemic attack (TIA), and cerebral infarction without residual deficits: Secondary | ICD-10-CM | POA: Insufficient documentation

## 2014-10-29 DIAGNOSIS — Z7951 Long term (current) use of inhaled steroids: Secondary | ICD-10-CM | POA: Diagnosis not present

## 2014-10-29 LAB — CBC WITH DIFFERENTIAL/PLATELET
BASOS ABS: 0 10*3/uL (ref 0.0–0.1)
BASOS PCT: 0 % (ref 0–1)
Eosinophils Absolute: 0.4 10*3/uL (ref 0.0–0.7)
Eosinophils Relative: 7 % — ABNORMAL HIGH (ref 0–5)
HEMATOCRIT: 39.7 % (ref 39.0–52.0)
HEMOGLOBIN: 12.7 g/dL — AB (ref 13.0–17.0)
LYMPHS PCT: 33 % (ref 12–46)
Lymphs Abs: 2.1 10*3/uL (ref 0.7–4.0)
MCH: 29.8 pg (ref 26.0–34.0)
MCHC: 32 g/dL (ref 30.0–36.0)
MCV: 93.2 fL (ref 78.0–100.0)
MONOS PCT: 8 % (ref 3–12)
Monocytes Absolute: 0.5 10*3/uL (ref 0.1–1.0)
NEUTROS ABS: 3.4 10*3/uL (ref 1.7–7.7)
NEUTROS PCT: 52 % (ref 43–77)
Platelets: 221 10*3/uL (ref 150–400)
RBC: 4.26 MIL/uL (ref 4.22–5.81)
RDW: 15 % (ref 11.5–15.5)
WBC: 6.5 10*3/uL (ref 4.0–10.5)

## 2014-10-29 NOTE — ED Notes (Signed)
Pt c/o bilat lower extremity swelling and rash on lower legs that has been going on x 1 week.

## 2014-10-29 NOTE — ED Provider Notes (Signed)
CSN: [id number]   Arrival date & time 10/29/14 1743  History  This chart was scribed for Shanon Rosser, MD by Altamease Oiler, ED Scribe. This patient was seen in room WA06/WA06 and the patient's care was started at 11:09 PM.  Chief Complaint  Patient presents with  . Leg Swelling    HPI The history is provided by the patient and a relative. No language interpreter was used.   Andrew Kaiser is a 54 y.o. male with PMHx of stroke who presents to the Emergency Department complaining of new bilateral lower extremity swelling with onset 1 week ago. The swelling has not worsened since initial onset. Associated symptoms include red rash at the bilateral lower extremities. Pt denies pain in the extremities, chest pain and SOB.   Past Medical History  Diagnosis Date  . Stroke   . Allergy   . Anxiety   . Depression   . PFO (patent foramen ovale)   . Memory disturbance 10/07/2014    Past Surgical History  Procedure Laterality Date  . Rhinoplasty    . Patent foramen ovale closure  2006    Family History  Problem Relation Age of Onset  . Colon cancer Neg Hx   . Pancreatic cancer Neg Hx   . Rectal cancer Neg Hx   . Stomach cancer Neg Hx   . Esophageal cancer Neg Hx   . Prostate cancer Neg Hx   . Stroke Mother   . Heart disease Father   . Asthma Father   . Stroke Sister   . Multiple sclerosis Sister     Social History  Substance Use Topics  . Smoking status: Former Smoker    Types: Cigars  . Smokeless tobacco: Never Used  . Alcohol Use: 0.0 oz/week    0 Standard drinks or equivalent per week     Comment: socially     Review of Systems 10 Systems reviewed and all are negative for acute change except as noted in the HPI.  Home Medications   Prior to Admission medications   Medication Sig Start Date End Date Taking? Authorizing Provider  cetirizine (ZYRTEC) 10 MG tablet Take 10 mg by mouth daily.   Yes Historical Provider, MD  citalopram (CELEXA) 20 MG tablet Take 1 tablet  by mouth every morning. 10/15/14  Yes Historical Provider, MD  fluticasone (FLONASE) 50 MCG/ACT nasal spray Place 2 sprays into both nostrils daily.   Yes Historical Provider, MD  hydrOXYzine (VISTARIL) 25 MG capsule Take 25 mg by mouth 3 (three) times daily as needed for itching.   Yes Historical Provider, MD  lamoTRIgine (LAMICTAL) 100 MG tablet Take 100 mg by mouth daily.   Yes Historical Provider, MD  risperiDONE (RISPERDAL) 3 MG tablet Take 3 mg by mouth at bedtime.   Yes Historical Provider, MD  traZODone (DESYREL) 100 MG tablet Take 100 mg by mouth at bedtime.   Yes Historical Provider, MD  aspirin 325 MG tablet Take 325 mg by mouth daily.    Historical Provider, MD  Fe Asp GBT-[id number]-H-[id number]-X10-GY (MULTIGEN FOLIC) 69-485-4-6 MG TABS Take 1 tablet by mouth daily.    Historical Provider, MD  Multiple Vitamin (MULTIVITAMIN WITH MINERALS) TABS tablet Take 1 tablet by mouth daily.    Historical Provider, MD  zolpidem (AMBIEN) 10 MG tablet Take 10 mg by mouth at bedtime as needed. For insomnia     Historical Provider, MD    Allergies  Review of patient's allergies indicates no known allergies.  Triage Vitals: BP  118/64 mmHg  Pulse 74  Temp(Src)   Resp 17  SpO2 96%  Physical Exam  Nursing note and vitals reviewed. General: Well-developed, well-nourished male in no acute distress; appearance consistent with age of record HENT: normocephalic; atraumatic Eyes: pupils equal, round and reactive to light; extraocular muscles intact Neck: supple Heart: regular rate and rhythm; no murmurs, rubs or gallops Lungs: clear to auscultation bilaterally Abdomen: soft; nondistended; nontender; no masses or hepatosplenomegaly; bowel sounds present Extremities: No deformity; full range of motion; pulses normal, 2+ pitting edema of BLEs with some erythematous plaques but no warmth    Neurologic: Awake, alert and oriented; motor function intact in all extremities and symmetric; no facial droop Skin: Warm  and dry Psychiatric: Normal mood and affect  ED Course  Procedures   DIAGNOSTIC STUDIES: Oxygen Saturation is 96% on RA, normal by my interpretation.    COORDINATION OF CARE: 11:13 PM Discussed treatment plan which includes lab work with pt at bedside and pt agreed to plan.   MDM   Nursing notes and vitals signs, including pulse oximetry, reviewed.  Summary of this visit's results, reviewed by myself:  Labs:  Results for orders placed or performed during the hospital encounter of 10/29/14 (from the past 24 hour(s))  Comprehensive metabolic panel     Status: Abnormal   Collection Time: 10/29/14 11:41 PM  Result Value Ref Range   Sodium 139 135 - 145 mmol/L   Potassium 3.8 3.5 - 5.1 mmol/L   Chloride 105 101 - 111 mmol/L   CO2 30 22 - 32 mmol/L   Glucose, Bld 94 65 - 99 mg/dL   BUN 19 6 - 20 mg/dL   Creatinine, Ser 1.05 0.61 - 1.24 mg/dL   Calcium 8.8 (L) 8.9 - 10.3 mg/dL   Total Protein 6.8 6.5 - 8.1 g/dL   Albumin 3.9 3.5 - 5.0 g/dL   AST 23 15 - 41 U/L   ALT 19 17 - 63 U/L   Alkaline Phosphatase 44 38 - 126 U/L   Total Bilirubin 0.4 0.3 - 1.2 mg/dL   GFR calc non Af Amer >60 >60 mL/min   GFR calc Af Amer >60 >60 mL/min   Anion gap 4 (L) 5 - 15  CBC with Differential/Platelet     Status: Abnormal   Collection Time: 10/29/14 11:41 PM  Result Value Ref Range   WBC 6.5 4.0 - 10.5 K/uL   RBC 4.26 4.22 - 5.81 MIL/uL   Hemoglobin 12.7 (L) 13.0 - 17.0 g/dL   HCT 39.7 39.0 - 52.0 %   MCV 93.2 78.0 - 100.0 fL   MCH 29.8 26.0 - 34.0 pg   MCHC 32.0 30.0 - 36.0 g/dL   RDW 15.0 11.5 - 15.5 %   Platelets 221 150 - 400 K/uL   Neutrophils Relative % 52 43 - 77 %   Neutro Abs 3.4 1.7 - 7.7 K/uL   Lymphocytes Relative 33 12 - 46 %   Lymphs Abs 2.1 0.7 - 4.0 K/uL   Monocytes Relative 8 3 - 12 %   Monocytes Absolute 0.5 0.1 - 1.0 K/uL   Eosinophils Relative 7 (H) 0 - 5 %   Eosinophils Absolute 0.4 0.0 - 0.7 K/uL   Basophils Relative 0 0 - 1 %   Basophils Absolute 0.0 0.0 -  0.1 K/uL  Brain natriuretic peptide     Status: None   Collection Time: 10/29/14 11:41 PM  Result Value Ref Range   B Natriuretic Peptide 16.1 0.0 -  100.0 pg/mL  D-dimer, quantitative (not at [facility])     Status: Abnormal   Collection Time: 10/29/14 11:41 PM  Result Value Ref Range   D-Dimer, Quant 1.37 (H) 0.00 - 0.48 ug/mL-FEU   We'll have patient return for venous Dopplers. We will try short course of Lasix for his edema and have him follow-up with his PCP.  I personally performed the services described in this documentation, which was scribed in my presence. The recorded information has been reviewed and is accurate.   Shanon Rosser, MD 10/30/14 [phone]

## 2014-10-30 ENCOUNTER — Ambulatory Visit (HOSPITAL_COMMUNITY)
Admission: RE | Admit: 2014-10-30 | Discharge: 2014-10-30 | Disposition: A | Discharge status: Home / Self Care | Payer: Medicaid Other | Source: Ambulatory Visit | Attending: Emergency Medicine | Admitting: Emergency Medicine

## 2014-10-30 DIAGNOSIS — M7989 Other specified soft tissue disorders: Secondary | ICD-10-CM | POA: Diagnosis not present

## 2014-10-30 LAB — COMPREHENSIVE METABOLIC PANEL
ALBUMIN: 3.9 g/dL (ref 3.5–5.0)
ALK PHOS: 44 U/L (ref 38–126)
ALT: 19 U/L (ref 17–63)
ANION GAP: 4 — AB (ref 5–15)
AST: 23 U/L (ref 15–41)
BUN: 19 mg/dL (ref 6–20)
CALCIUM: 8.8 mg/dL — AB (ref 8.9–10.3)
CO2: 30 mmol/L (ref 22–32)
Chloride: 105 mmol/L (ref 101–111)
Creatinine, Ser: 1.05 mg/dL (ref 0.61–1.24)
GFR calc non Af Amer: >60 mL/min (ref >60)
Glucose, Bld: 94 mg/dL (ref 65–99)
POTASSIUM: 3.8 mmol/L (ref 3.5–5.1)
SODIUM: 139 mmol/L (ref 135–145)
Total Bilirubin: 0.4 mg/dL (ref 0.3–1.2)
Total Protein: 6.8 g/dL (ref 6.5–8.1)

## 2014-10-30 LAB — D-DIMER, QUANTITATIVE (NOT AT ARMC): D DIMER QUANT: 1.37 ug{FEU}/mL — AB (ref 0.00–0.48)

## 2014-10-30 LAB — BRAIN NATRIURETIC PEPTIDE: B Natriuretic Peptide: 16.1 pg/mL (ref 0.0–100.0)

## 2014-10-30 MED ORDER — FUROSEMIDE 20 MG PO TABS: ORAL_TABLET | ORAL | Status: DC

## 2014-10-30 MED ORDER — ENOXAPARIN SODIUM 100 MG/ML ~~LOC~~ SOLN
90.0000 mg | Freq: Once | SUBCUTANEOUS | Status: AC
  Administered 2014-10-30: 90 mg via SUBCUTANEOUS
  Filled 2014-10-30: qty 1

## 2014-10-30 NOTE — Progress Notes (Signed)
Preliminary results by tech - Bilateral Lower Ext. Venous Duplex Completed. Negative for deep and superficial vein thrombosis in both lower extremities.  Aleksa Catterton, BS, RDMS, RVT  

## 2017-06-08 ENCOUNTER — Ambulatory Visit: Payer: Self-pay | Attending: Family Medicine

## 2017-06-09 ENCOUNTER — Ambulatory Visit (HOSPITAL_COMMUNITY)
Admission: EM | Admit: 2017-06-09 | Discharge: 2017-06-09 | Disposition: A | Discharge status: Home / Self Care | Payer: Medicaid Other | Attending: Family | Admitting: Family

## 2017-06-09 ENCOUNTER — Other Ambulatory Visit: Payer: Self-pay

## 2017-06-09 ENCOUNTER — Encounter (HOSPITAL_COMMUNITY): Payer: Self-pay | Admitting: Emergency Medicine

## 2017-06-09 DIAGNOSIS — M25561 Pain in right knee: Secondary | ICD-10-CM

## 2017-06-09 DIAGNOSIS — G8929 Other chronic pain: Secondary | ICD-10-CM

## 2017-06-09 HISTORY — DX: Bipolar disorder, unspecified: F31.9

## 2017-06-09 MED ORDER — DICLOFENAC SODIUM 1 % TD GEL: 4.0000 g | Freq: Four times a day (QID) | TRANSDERMAL | 1 refills | Status: DC | PRN

## 2017-06-09 MED FILL — DICLOFENAC SODIUM 1% GEL: 1 | 6 days supply | Qty: 100 | Fill #0

## 2017-06-09 MED FILL — traZODone HCL 150 MG TABS: 150 | 30 days supply | Qty: 60 | Fill #0

## 2017-06-09 MED FILL — CITALOPRAM HBR 20 MG TABLET: 20 | 30 days supply | Qty: 30 | Fill #0

## 2017-06-09 MED FILL — risperiDONE 3 MG TABS: 3 | 30 days supply | Qty: 45 | Fill #0

## 2017-06-09 MED FILL — ?CETIRIZINE HCL 10 MG TABLE: 10 | 30 days supply | Qty: 30 | Fill #0

## 2017-06-09 NOTE — ED Provider Notes (Signed)
[facility]    CSN: [id number] Arrival date & time: 06/09/17  1235     History   Chief Complaint Chief Complaint  Patient presents with  . Knee Pain    HPI Andrew Kaiser is a 57 y.o. male.   57 year old male presents with right knee pain that has been occurring for over 6 months. No known injury. Pain is mostly in the anterior aspect of the knee. Has been getting worse in the past few weeks. Feels his knee is  "popping" at times. No radiation of pain. No numbness. Walking makes his symptoms worse. He has tried Tylenol and Naproxen with no relief. Is accompanied by his mom and has multiple questions regarding etiology. Other chronic health issues include bipolar disorder in which he is on Lamictal, Risperdal and Celexa as well as insomnia which he takes Trazodone and Ambien prn. Other issues include history of stroke and memory loss.   The history is provided by the patient and a parent.    Past Medical History:  Diagnosis Date  . Allergy   . Anxiety   . Bipolar affective ([place])   . Depression   . Memory disturbance 10/07/2014  . PFO (patent foramen ovale)   . Stroke [facility])     Patient Active Problem List   Diagnosis Date Noted  . Memory disturbance 10/07/2014  . Bipolar disorder, current episode manic without psychotic features, severe ([place]) 01/17/2011  . Cerebral vascular disease 01/17/2011    Class: History of  . ALLERGIC RHINITIS 10/27/2006    Past Surgical History:  Procedure Laterality Date  . PATENT FORAMEN [place]E CLOSURE  2006  . RHINOPLASTY         Home Medications    Prior to Admission medications   Medication Sig Start Date End Date Taking? Authorizing Provider  cetirizine (ZYRTEC) 10 MG tablet Take 10 mg by mouth daily.    [provider]  citalopram (CELEXA) 20 MG tablet Take 1 tablet by mouth every morning. 10/15/14   [provider]  diclofenac sodium (VOLTAREN) 1 % GEL Apply 4 g topically 4 (four) times daily as  needed (for pain). 06/09/17   Katy Apo, NP  Fe Asp [id number]-G-[id number]-H41-DE (MULTIGEN FOLIC) 08-144-8-1 MG TABS Take 1 tablet by mouth daily.    [provider]  fluticasone (FLONASE) 50 MCG/ACT nasal spray Place 2 sprays into both nostrils daily.    [provider]  hydrOXYzine (VISTARIL) 25 MG capsule Take 25 mg by mouth 3 (three) times daily as needed for itching.    [provider]  lamoTRIgine (LAMICTAL) 100 MG tablet Take 100 mg by mouth daily.    [provider]  Multiple Vitamin (MULTIVITAMIN WITH MINERALS) TABS tablet Take 1 tablet by mouth daily.    [provider]  risperiDONE (RISPERDAL) 3 MG tablet Take 3 mg by mouth at bedtime.    [provider]  traZODone (DESYREL) 100 MG tablet Take 100 mg by mouth at bedtime.    [provider]  zolpidem (AMBIEN) 10 MG tablet Take 10 mg by mouth at bedtime as needed. For insomnia     [provider]    Family History Family History  Problem Relation Age of Onset  . Stroke Mother   . Heart disease Father   . Asthma Father   . Stroke Sister   . Multiple sclerosis Sister   . Colon cancer Neg Hx   . Pancreatic cancer Neg Hx   .  Rectal cancer Neg Hx   . Stomach cancer Neg Hx   . Esophageal cancer Neg Hx   . Prostate cancer Neg Hx     Social History Social History   Tobacco Use  . Smoking status: Former Smoker    Types: Cigars  . Smokeless tobacco: Never Used  Substance Use Topics  . Alcohol use: Yes    Alcohol/week: 0.0 oz    Comment: socially  . Drug use: No     Allergies   Patient has no known allergies.   Review of Systems Review of Systems  Constitutional: Positive for activity change. Negative for appetite change, chills, fatigue and fever.  Genitourinary: Negative for decreased urine volume, difficulty urinating, dysuria and hematuria.  Musculoskeletal: Positive for arthralgias, gait problem and myalgias. Negative for back pain, neck pain  and neck stiffness.  Skin: Negative for color change, rash and wound.  Allergic/Immunologic: Positive for environmental allergies.  Neurological: Negative for dizziness, tremors, seizures, syncope, speech difficulty, weakness, light-headedness, numbness and headaches.  Hematological: Negative for adenopathy. Does not bruise/bleed easily.  Psychiatric/Behavioral: Positive for decreased concentration and sleep disturbance.     Physical Exam Triage Vital Signs ED Triage Vitals  Enc Vitals Group     BP 06/09/17 1312 121/68     Pulse Rate 06/09/17 1312 71     Resp --      Temp 06/09/17 1312 98 F (36.7 C)     Temp Source 06/09/17 1312 Oral     SpO2 06/09/17 1312 99 %     Weight --      Height --      Head Circumference --      Peak Flow --      Pain Score 06/09/17 1307 10     Pain Loc --      Pain Edu? --      Excl. in [place]? --    No data found.  Updated Vital Signs BP 121/68 (BP Location: Left Arm)   Pulse 71   Temp 98 F (36.7 C) (Oral)   SpO2 99%   Visual Acuity Right Eye Distance:   Left Eye Distance:   Bilateral Distance:    Right Eye Near:   Left Eye Near:    Bilateral Near:     Physical Exam  Constitutional: He is oriented to person, place, and time. Vital signs are normal. He appears well-developed and well-nourished. He is cooperative. No distress.  Has difficulty ambulating from chair to exam table but in no acute distress.   HENT:  Head: Normocephalic and atraumatic.  Eyes: Conjunctivae and EOM are normal.  Neck: Normal range of motion.  Cardiovascular: Normal rate.  Pulmonary/Chest: Effort normal.  Musculoskeletal: He exhibits tenderness.       Right knee: He exhibits decreased range of motion. He exhibits no swelling, no ecchymosis, no deformity, no erythema, normal alignment, no LCL laxity and normal patellar mobility. Tenderness found. Medial joint line and lateral joint line tenderness noted.       Legs: Has significant decrease range of motion.  Unable to bend right knee more than 45 degrees. Stiff and slightly tender over entire anterior aspect of knee. Unable to pinpoint where more significant pain is. No numbness or distinct neuro deficits noted. Good distal pulses.   Neurological: He is alert and oriented to person, place, and time. He has normal reflexes. He displays no atrophy and no tremor. No sensory deficit. He exhibits normal muscle tone. He displays no seizure activity. Gait abnormal.  Skin: Skin is warm and dry. Capillary refill takes less than 2 seconds. No rash noted. No erythema.  Psychiatric: His speech is normal. Thought content normal. His mood appears anxious.  Vitals reviewed.    UC Treatments / Results  Labs (all labs ordered are listed, but only abnormal results are displayed) Labs Reviewed - No data to display  EKG None Radiology No results found.  Procedures Procedures (including critical care time)  Medications Ordered in UC Medications - No data to display   Initial Impression / Assessment and Plan / UC Course  I have reviewed the triage vital signs and the nursing notes.  Pertinent labs & imaging results that were available during my care of the patient were reviewed by me and considered in my medical decision making (see chart for details).    Discussed possible etiologies. Patient declined x-ray of the knee because he did not want to try to bend his knee. Reviewed that knee joint is "stiff" and importance of working on strengthening muscles around knee and stretching knee joint. May trial Voltaren 1% gel- apply to knee 4 times a day as needed for pain. May apply warm moist heat to area 3-4 times a day as needed. Provided knee exercises to help strengthen knee and leg muscles. Recommend follow-up with an  Orthopedic (information provided) within 2 weeks if knee pain does not improve.    Final Clinical Impressions(s) / UC Diagnoses   Final diagnoses:  Chronic pain of right knee    ED  Discharge Orders        Ordered    diclofenac sodium (VOLTAREN) 1 % GEL  4 times daily PRN     06/09/17 1407       Controlled Substance Prescriptions [place] Controlled Substance Registry consulted? Not Applicable   Katy Apo, NP 06/10/17 [phone]

## 2017-06-09 NOTE — Discharge Instructions (Addendum)
Recommend trial Voltaren gel- apply to knee 4 times a day as needed for pain. May apply warm moist heat to area 3-4 times a day as needed. Recommend do knee exercises to help strengthen knee and leg muscles. Recommend follow-up with the Orthopedic within 1-2 weeks if knee pain does not improve.

## 2017-06-09 NOTE — ED Triage Notes (Signed)
C/o right knee pain for a while, no known injury

## 2017-06-24 ENCOUNTER — Ambulatory Visit (INDEPENDENT_AMBULATORY_CARE_PROVIDER_SITE_OTHER): Payer: Self-pay | Admitting: Family Medicine

## 2017-06-24 ENCOUNTER — Encounter: Payer: Self-pay | Admitting: Family Medicine

## 2017-06-24 VITALS — BP 105/68 | HR 75 | Temp 98.9°F | Resp 14 | Ht 72.0 in | Wt 227.0 lb

## 2017-06-24 DIAGNOSIS — Z23 Encounter for immunization: Secondary | ICD-10-CM

## 2017-06-24 DIAGNOSIS — M25561 Pain in right knee: Secondary | ICD-10-CM

## 2017-06-24 DIAGNOSIS — G8929 Other chronic pain: Secondary | ICD-10-CM

## 2017-06-24 DIAGNOSIS — T148XXA Other injury of unspecified body region, initial encounter: Secondary | ICD-10-CM

## 2017-06-24 DIAGNOSIS — L299 Pruritus, unspecified: Secondary | ICD-10-CM

## 2017-06-24 DIAGNOSIS — F3113 Bipolar disorder, current episode manic without psychotic features, severe: Secondary | ICD-10-CM

## 2017-06-24 DIAGNOSIS — R21 Rash and other nonspecific skin eruption: Secondary | ICD-10-CM

## 2017-06-24 MED ORDER — MELOXICAM 7.5 MG PO TABS: 7.5000 mg | ORAL_TABLET | Freq: Every day | ORAL | 0 refills | Status: DC

## 2017-06-24 MED ORDER — TRIAMCINOLONE ACETONIDE 0.1 % EX CREA: 1.0000 "application " | TOPICAL_CREAM | Freq: Two times a day (BID) | CUTANEOUS | 0 refills | Status: DC

## 2017-06-24 MED FILL — TRIAMCINOLONE ACETONIDE 0.1: 0.1 | 15 days supply | Qty: 30 | Fill #0

## 2017-06-24 MED FILL — MELOXICAM 7.5 MG TABLET: 7.5 | 30 days supply | Qty: 30 | Fill #0

## 2017-06-24 NOTE — Patient Instructions (Addendum)
For abdominal rash, will start a trial of triamcinolone cream.  Apply to affected area twice daily for 10 days.  For dry skin, I recommend a lukewarm shower, followed by baby oil application, pat skin dry opposed to rubbing, then lastly apply lotion or Vaseline. You have chronic pain of your right knee, I recommend that we start meloxicam 7.5 mg daily, which is an anti-inflammatory medication.  Make sure that you eat with this medication to prevent any stomach upset.  Follow-up with me in 2 weeks for your rash.  Your blood pressure is normal today. In 3 months I recommend that you return for a complete physical exam.  During that exam, will get an EKG, and labs

## 2017-06-24 NOTE — Progress Notes (Signed)
Subjective:    Patient ID: Andrew Kaiser, male    DOB: [birth year], 57 y.o.   MRN: [id number]  HPI  Andrew Kaiser, a 57 year old male with a history of bipolar disorder presents to establish care. Patient has not had a primary provider due to financial and insurance constraints.  Patient has a history of bipolar disorder, which is controlled on current medication regimen. He is followed by [facility] every other week and meets with counselor. He endorses periodic feelings of anhedonia and racing thoughts. He currently denies suicidal or homicidal ideations.   Patient also complaining of right knee pain. Pain has been present over the past several months. Pain is aggravated by prolonged standing and transitioning from sitting to standing. Patient has not attempted OTC analgesia to alleviate symptoms. Current pain intensity is 4-5/10 characterized as intermittent and aching.  Patient has had prior knee problems. Right knee pain has not been evaluated to date.    Patient complains of rash involving the abdomen. Rash started a few weeks ago. Appearance of rash at onset was erythematous. Patient characrerizes rash as itching. Rash has changed over time due to excessive scratching.  Denies: abdominal pain, congestion, cough, crankiness, decrease in appetite, fever and headache. Patient has not had previous evaluation of rash. Patient has not had previous treatment. Patient has not had contacts with similar rash. He has not identified precipitant. Patient has not had new exposures (soaps, lotions, laundry detergents, foods, medications, plants, insects or animals.)   Past Medical History:  Diagnosis Date  . Allergy   . Anxiety   . Bipolar affective ([place])   . Depression   . Memory disturbance 10/07/2014  . PFO (patent foramen ovale)   . Stroke [facility])    Social History   Socioeconomic History  . Marital status: Divorced    Spouse name: Not on file  . Number of children: 1  .  Years of education: 22  . Highest education level: Not on file  Occupational History  . Occupation: unemployed  Social Needs  . Financial resource strain: Not on file  . Food insecurity:    Worry: Not on file    Inability: Not on file  . Transportation needs:    Medical: Not on file    Non-medical: Not on file  Tobacco Use  . Smoking status: Former Smoker    Types: Cigars  . Smokeless tobacco: Never Used  Substance and Sexual Activity  . Alcohol use: Yes    Alcohol/week: 0.0 oz    Comment: socially  . Drug use: No  . Sexual activity: Not on file  Lifestyle  . Physical activity:    Days per week: Not on file    Minutes per session: Not on file  . Stress: Not on file  Relationships  . Social connections:    Talks on phone: Not on file    Gets together: Not on file    Attends religious service: Not on file    Active member of club or organization: Not on file    Attends meetings of clubs or organizations: Not on file    Relationship status: Not on file  . Intimate partner violence:    Fear of current or ex partner: Not on file    Emotionally abused: Not on file    Physically abused: Not on file    Forced sexual activity: Not on file  Other Topics Concern  . Not on file  Social History Narrative  Patient drinks caffeine occasionally.   Patient is left handed.     Review of Systems  HENT: Negative.   Respiratory: Negative.   Cardiovascular: Negative.   Gastrointestinal: Negative.   Endocrine: Negative for polydipsia, polyphagia and polyuria.  Genitourinary: Negative.   Musculoskeletal: Positive for arthralgias and joint swelling.       Right knee pain  Skin: Positive for rash (abdomen).  Allergic/Immunologic: Negative for immunocompromised state.  Neurological: Negative.   Hematological: Negative.   Psychiatric/Behavioral: Negative.  Negative for sleep disturbance. The patient is not nervous/anxious.        History of bipolar disorder controlled on current  medication regimen       Objective:   Physical Exam  Constitutional: He is oriented to person, place, and time. He appears well-developed and well-nourished.  HENT:  Head: Normocephalic and atraumatic.  Right Ear: External ear normal.  Left Ear: External ear normal.  Nose: Nose normal.  Mouth/Throat: Oropharynx is clear and moist.  Eyes: Pupils are equal, round, and reactive to light.  Cardiovascular: Normal rate, regular rhythm, normal heart sounds and intact distal pulses.  Pulmonary/Chest: Effort normal and breath sounds normal.  Abdominal: Soft. Bowel sounds are normal.  Musculoskeletal:       Right knee: He exhibits decreased range of motion and swelling.  Neurological: He is alert and oriented to person, place, and time. He has normal reflexes.  Skin: Rash noted. Rash is macular.  Excoriation, erythematous, and hyperpigmented  Psychiatric: He has a normal mood and affect. His behavior is normal. Judgment and thought content normal.         BP 105/68 (BP Location: Left Arm, Patient Position: Sitting, Cuff Size: Large)   Pulse 75   Temp 98.9 F (37.2 C) (Oral)   Resp 14   Ht 6' (1.829 m)   Wt 227 lb (103 kg)   SpO2 99%   BMI 30.79 kg/m  Assessment & Plan:  1. Bipolar disorder, current episode manic without psychotic features, severe ([place]) Continue to follow up with [facility] as scheduled 2. Need for Tdap vaccination - Tdap vaccine greater than or equal to 7yo IM  3. Skin excoriation Recommend hypoallergenic lotions and body wash.  Recommend luke warm shower and pat dry after shower - triamcinolone cream (KENALOG) 0.1 %; Apply 1 application topically 2 (two) times daily.  Dispense: 30 g; Refill: 0  4. Chronic pain of right knee Will start a trial of meloxicam.  - meloxicam (MOBIC) 7.5 MG tablet; Take 1 tablet (7.5 mg total) by mouth daily.  Dispense: 30 tablet; Refill: 0  5. Rash  - triamcinolone cream (KENALOG) 0.1 %; Apply 1 application  topically 2 (two) times daily.  Dispense: 30 g; Refill: 0  6. Pruritus  - triamcinolone cream (KENALOG) 0.1 %; Apply 1 application topically 2 (two) times daily.  Dispense: 30 g; Refill: 0   RTC: 2 weeks for rash/excoriation  Donia Pounds  MSN, FNP-C Patient [place] [address] [place], [place] [address] [phone]

## 2017-07-14 ENCOUNTER — Ambulatory Visit (INDEPENDENT_AMBULATORY_CARE_PROVIDER_SITE_OTHER): Payer: Self-pay | Admitting: Family Medicine

## 2017-07-14 VITALS — BP 118/72 | HR 80 | Temp 98.6°F | Resp 16 | Ht 72.0 in | Wt 225.0 lb

## 2017-07-14 DIAGNOSIS — F3113 Bipolar disorder, current episode manic without psychotic features, severe: Secondary | ICD-10-CM

## 2017-07-14 DIAGNOSIS — L309 Dermatitis, unspecified: Secondary | ICD-10-CM

## 2017-07-14 NOTE — Progress Notes (Signed)
Subjective:    Patient ID: Andrew Kaiser, male    DOB: 02/07/1961, 57 y.o.   MRN: [id number]  HPI  Andrew Kaiser, a 57 year old male with a history of bipolar disorder presents for a follow up of abdominal rash. .  Patient has a history of bipolar disorder, which is controlled on current medication regimen. He is followed by [facility] every other week and meets with counselor. He endorses periodic feelings of anhedonia and racing thoughts. He currently denies suicidal or homicidal ideations.   Patient complains of rash involving the abdomen. Rash started a few weeks ago. Appearance of rash at onset was erythematous. Patient characrerizes rash as itching. Andrew Kaiser was trialed on [place]olog 0.1% cream 2 months ago. Patient says that rash has resolved. Denies: abdominal pain, congestion, cough, crankiness, decrease in appetite, fever and headache.   Past Medical History:  Diagnosis Date  . Allergy   . Anxiety   . Bipolar affective ([place])   . Depression   . Memory disturbance 10/07/2014  . PFO (patent foramen ovale)   . Stroke [facility])    Social History   Socioeconomic History  . Marital status: Divorced    Spouse name: Not on file  . Number of children: 1  . Years of education: 37  . Highest education level: Not on file  Occupational History  . Occupation: unemployed  Social Needs  . Financial resource strain: Not on file  . Food insecurity:    Worry: Not on file    Inability: Not on file  . Transportation needs:    Medical: Not on file    Non-medical: Not on file  Tobacco Use  . Smoking status: Former Smoker    Types: Cigars  . Smokeless tobacco: Never Used  Substance and Sexual Activity  . Alcohol use: Yes    Alcohol/week: 0.0 oz    Comment: socially  . Drug use: No  . Sexual activity: Not on file  Lifestyle  . Physical activity:    Days per week: Not on file    Minutes per session: Not on file  . Stress: Not on file  Relationships  . Social  connections:    Talks on phone: Not on file    Gets together: Not on file    Attends religious service: Not on file    Active member of club or organization: Not on file    Attends meetings of clubs or organizations: Not on file    Relationship status: Not on file  . Intimate partner violence:    Fear of current or ex partner: Not on file    Emotionally abused: Not on file    Physically abused: Not on file    Forced sexual activity: Not on file  Other Topics Concern  . Not on file  Social History Narrative   Patient drinks caffeine occasionally.   Patient is left handed.     Review of Systems  HENT: Negative.   Respiratory: Negative.   Cardiovascular: Negative.   Gastrointestinal: Negative.   Endocrine: Negative for polydipsia, polyphagia and polyuria.  Genitourinary: Negative.   Skin: Positive for rash.  Allergic/Immunologic: Negative for immunocompromised state.  Neurological: Negative.   Hematological: Negative.   Psychiatric/Behavioral: Negative.  Negative for sleep disturbance. The patient is not nervous/anxious.        History of bipolar disorder controlled on current medication regimen       Objective:   Physical Exam  Constitutional: He is oriented  to person, place, and time. He appears well-developed and well-nourished.  HENT:  Head: Normocephalic and atraumatic.  Right Ear: External ear normal.  Left Ear: External ear normal.  Nose: Nose normal.  Mouth/Throat: Oropharynx is clear and moist.  Eyes: Pupils are equal, round, and reactive to light.  Cardiovascular: Normal rate, regular rhythm, normal heart sounds and intact distal pulses.  Pulmonary/Chest: Effort normal and breath sounds normal.  Abdominal: Soft. Bowel sounds are normal.  Musculoskeletal:       Right knee: He exhibits decreased range of motion and swelling.  Neurological: He is alert and oriented to person, place, and time. He has normal reflexes.  Skin: Rash noted.  Psychiatric: He has a  normal mood and affect. His behavior is normal. Judgment and thought content normal.         BP 118/72 (BP Location: Right Arm, Patient Position: Sitting, Cuff Size: Large)   Pulse 80   Temp 98.6 F (37 C) (Oral)   Resp 16   Ht 6' (1.829 m)   Wt 225 lb (102.1 kg)   SpO2 98%   BMI 30.52 kg/m  Assessment & Plan:  1. Dermatitis Rash resolved, no further treatment or evaluation warranted.   2. Bipolar disorder, current episode manic without psychotic features, severe ([place]) Continue to follow up with [facility] as scheduled   RTC: 6 months for CPE  Donia Pounds  MSN, FNP-C Patient [place] [address] [place], [place] [address] [phone]

## 2017-07-18 ENCOUNTER — Encounter: Payer: Self-pay | Admitting: Family Medicine

## 2017-07-18 DIAGNOSIS — F17200 Nicotine dependence, unspecified, uncomplicated: Secondary | ICD-10-CM | POA: Insufficient documentation

## 2017-08-18 MED FILL — ?DIVALPROEX SOD ER 500 MG T: 500 | 30 days supply | Qty: 30 | Fill #0

## 2017-08-18 MED FILL — traZODone HCL 150 MG TABS: 150 | 30 days supply | Qty: 60 | Fill #0

## 2017-08-18 MED FILL — risperiDONE 3 MG TABS: 3 | 30 days supply | Qty: 45 | Fill #0

## 2017-08-18 MED FILL — ?CETIRIZINE HCL 10 MG TABLE: 10 | 30 days supply | Qty: 30 | Fill #0

## 2017-08-31 ENCOUNTER — Ambulatory Visit: Payer: Medicaid Other

## 2017-08-31 MED FILL — ?CITALOPRAM HBR 20 MG TABLE: 20 | 30 days supply | Qty: 30 | Fill #0

## 2017-09-13 MED FILL — HYDROXYZINE PAM 50 MG CAP: 50 | 30 days supply | Qty: 60 | Fill #0

## 2017-09-14 ENCOUNTER — Ambulatory Visit: Payer: Self-pay | Attending: Family Medicine

## 2017-10-13 ENCOUNTER — Ambulatory Visit: Payer: Medicaid Other | Admitting: Family Medicine

## 2018-07-25 ENCOUNTER — Encounter: Payer: Self-pay | Admitting: Gastroenterology

## 2018-08-21 ENCOUNTER — Ambulatory Visit: Payer: Medicaid Other | Admitting: *Deleted

## 2018-08-21 ENCOUNTER — Other Ambulatory Visit: Payer: Self-pay

## 2018-08-21 VITALS — Ht 72.0 in | Wt 210.0 lb

## 2018-08-21 DIAGNOSIS — Z86010 Personal history of colonic polyps: Secondary | ICD-10-CM

## 2018-08-21 MED ORDER — PEG 3350-KCL-NA BICARB-NACL 420 G PO SOLR: 4000.0000 mL | Freq: Once | ORAL | 0 refills | Status: AC

## 2018-08-21 MED FILL — PEG-3350 SOLUTION: 420 | 1 days supply | Qty: 4000 | Fill #0

## 2018-08-21 NOTE — Progress Notes (Signed)
No egg or soy allergy known to patient  No issues with past sedation with any surgeries  or procedures, no intubation problems  No diet pills per patient No home 02 use per patient  No blood thinners per patient  Pt denies issues with constipation  No A fib or A flutter  EMMI video sent to pt's e mail    Pt verified name, DOB, address and insurance during PV today. Pt mailed instruction packet to included paper to complete and mail back to [facility] with addressed and stamped envelope, Emmi video, copy of consent form to read and not return, and instructions. PV completed over the phone. Pt encouraged to call with questions or issues. Pt states he may have to RS due to court, he will call amd inform me if it's the same date and I will mail new instructions    Pt is aware that care partner will wait in the car during parking lot; if they feel like they will be too hot to wait in the car; they may wait in the lobby.  We want them to wear a mask (we do not have any that we can provide them), practice social distancing, and we will check their temperatures when they get here.  I did remind patient that their care partner needs to stay in the parking lot the entire time. Pt will wear mask into building

## 2018-09-01 ENCOUNTER — Telehealth: Payer: Self-pay | Admitting: Gastroenterology

## 2018-09-01 MED ORDER — PEG 3350-KCL-NA BICARB-NACL 420 G PO SOLR: 4000.0000 mL | Freq: Once | ORAL | 0 refills | Status: AC

## 2018-09-01 NOTE — Telephone Encounter (Signed)
Patient called in wanting to know when prep is going to be called into the pharmacy. He would like a call back to let him know when the prep has been called in.

## 2018-09-01 NOTE — Telephone Encounter (Signed)
Spoke with pt. He is requesting the bowel prep ([place]) be sent to [facility].  I resent the Rx as requested and the pt will let us know if he has a problem getting the prep.Gwyndolyn Saxon in [place]

## 2018-09-04 ENCOUNTER — Telehealth: Payer: Self-pay | Admitting: Gastroenterology

## 2018-09-04 NOTE — Telephone Encounter (Signed)
Called patient back and explained that we could not do his procedure until we had an answer from his recent Covid -19 test. He explained that he was supposed to call them back but never did because he "felt if it was positive, they would have called him." I explained that he should continue with his prep and call the testing center in the morning and let us know ASAP. Will await for his phone call in the morning.

## 2018-09-04 NOTE — Telephone Encounter (Signed)
Covid-19 Screening Questions  Do you now or have you had a fever in the last 14 days? No  Do you have any respiratory symptoms of shortness of breath or cough now or in the last 14 days? No  Do you have any family members or close contacts with diagnosed or suspected Covid-19 in the past 14 days? No  Have you been tested for Covid-19 and found to be positive? Yes  Pt made aware of that care partner may come to the lobby during the procedure but will need to provide their own mask and was asked to bring one if available   Patient states that he was tested a few weeks ago for Covid but has not receive results. He was tested in [facility]

## 2018-09-05 ENCOUNTER — Ambulatory Visit (AMBULATORY_SURGERY_CENTER): Payer: Medicaid Other | Admitting: Gastroenterology

## 2018-09-05 ENCOUNTER — Encounter: Payer: Self-pay | Admitting: Gastroenterology

## 2018-09-05 ENCOUNTER — Telehealth: Payer: Self-pay

## 2018-09-05 ENCOUNTER — Other Ambulatory Visit: Payer: Self-pay

## 2018-09-05 VITALS — BP 102/66 | HR 62 | Temp 98.5°F | Resp 22 | Ht 72.0 in | Wt 210.0 lb

## 2018-09-05 DIAGNOSIS — Z538 Procedure and treatment not carried out for other reasons: Secondary | ICD-10-CM

## 2018-09-05 DIAGNOSIS — Z86010 Personal history of colonic polyps: Secondary | ICD-10-CM

## 2018-09-05 MED ORDER — SODIUM CHLORIDE 0.9 % IV SOLN: 500.0000 mL | Freq: Once | INTRAVENOUS | Status: DC

## 2018-09-05 NOTE — Patient Instructions (Signed)
FOLLOW HIGH FIBER DIET ( GIVEN TO YOU TODAY)  DR MANSOURATY RECOMMENDS REPEAT COLONOSCOPY WITHIN ONE YEAR ( SEE COLONOSCOPY REPORT)   YOU HAD AN ENDOSCOPIC PROCEDURE TODAY AT [facility]:   Refer to the procedure report that was given to you for any specific questions about what was found during the examination.  If the procedure report does not answer your questions, please call your gastroenterologist to clarify.  If you requested that your care partner not be given the details of your procedure findings, then the procedure report has been included in a sealed envelope for you to review at your convenience later.  YOU SHOULD EXPECT: Some feelings of bloating in the abdomen. Passage of more gas than usual.  Walking can help get rid of the air that was put into your GI tract during the procedure and reduce the bloating. If you had a lower endoscopy (such as a colonoscopy or flexible sigmoidoscopy) you may notice spotting of blood in your stool or on the toilet paper. If you underwent a bowel prep for your procedure, you may not have a normal bowel movement for a few days.  Please Note:  You might notice some irritation and congestion in your nose or some drainage.  This is from the oxygen used during your procedure.  There is no need for concern and it should clear up in a day or so.  SYMPTOMS TO REPORT IMMEDIATELY:   Following lower endoscopy (colonoscopy or flexible sigmoidoscopy):  Excessive amounts of blood in the stool  Significant tenderness or worsening of abdominal pains  Swelling of the abdomen that is new, acute  Fever of 100F or higher    For urgent or emergent issues, a gastroenterologist can be reached at any hour by calling [phone].   DIET:  We do recommend a small meal at first, but then you may proceed to your regular diet.  Drink plenty of fluids but you should avoid alcoholic beverages for 24 hours.  ACTIVITY:  You should plan to take it easy  for the rest of today and you should NOT DRIVE or use heavy machinery until tomorrow (because of the sedation medicines used during the test).    FOLLOW UP: Our staff will call the number listed on your records 48-72 hours following your procedure to check on you and address any questions or concerns that you may have regarding the information given to you following your procedure. If we do not reach you, we will leave a message.  We will attempt to reach you two times.  During this call, we will ask if you have developed any symptoms of COVID 19. If you develop any symptoms (ie: fever, flu-like symptoms, shortness of breath, cough etc.) before then, please call [phone].  If you test positive for Covid 19 in the 2 weeks post procedure, please call and report this information to [place].    If any biopsies were taken you will be contacted by phone or by letter within the next 1-3 weeks.  Please call us at [phone] if you have not heard about the biopsies in 3 weeks.    SIGNATURES/CONFIDENTIALITY: You and/or your care partner have signed paperwork which will be entered into your electronic medical record.  These signatures attest to the fact that that the information above on your After Visit Summary has been reviewed and is understood.  Full responsibility of the confidentiality of this discharge information lies with you and/or your care-partner.

## 2018-09-05 NOTE — Telephone Encounter (Signed)
Thanks for update. GM 

## 2018-09-05 NOTE — Telephone Encounter (Signed)
Please keep me up to date. Thanks. GM

## 2018-09-05 NOTE — Telephone Encounter (Signed)
Received a fax about his Covid-19 test results and it says "Not Detected", also the patient was tested on 08/04/2018. He had previously though it was 2 weeks ago. Patient will be here as directed.

## 2018-09-05 NOTE — Op Note (Signed)
[facility] Patient Name: Andrew Kaiser Procedure Date: 09/05/2018 1:57 PM MRN: [id number] Endoscopist: Justice Britain , MD Age: 58 Referring MD:  Date of Birth: 02/01/1961 Gender: Male Account #: [id number] Procedure:                Colonoscopy Indications:              Surveillance: Personal history of adenomatous                            polyps on last colonoscopy > 5 years ago, High risk                            colon cancer surveillance: Personal history of                            sessile serrated colon polyp (less than 10 mm in                            size) with no dysplasia Medicines:                Monitored Anesthesia Care Procedure:                Pre-Anesthesia Assessment:                           - Prior to the procedure, a History and Physical                            was performed, and patient medications and                            allergies were reviewed. The patient's tolerance of                            previous anesthesia was also reviewed. The risks                            and benefits of the procedure and the sedation                            options and risks were discussed with the patient.                            All questions were answered, and informed consent                            was obtained. Prior Anticoagulants: The patient has                            taken no previous anticoagulant or antiplatelet                            agents except for NSAID medication. ASA Grade  Assessment: II - A patient with mild systemic                            disease. After reviewing the risks and benefits,                            the patient was deemed in satisfactory condition to                            undergo the procedure.                           After obtaining informed consent, the colonoscope                            was passed under direct vision. Throughout the               procedure, the patient's blood pressure, pulse, and                            oxygen saturations were monitored continuously. The                            Colonoscope was introduced through the anus and                            advanced to the the cecum, identified by the                            appendiceal orifice. The colonoscopy was performed                            without difficulty. The patient tolerated the                            procedure. The quality of the bowel preparation was                            unsatisfactory. Scope In: 2:00:30 PM Scope Out: 2:15:39 PM Scope Withdrawal Time: 0 hours 4 minutes 18 seconds  Total Procedure Duration: 0 hours 15 minutes 9 seconds  Findings:                 The digital rectal exam findings include                            non-thrombosed internal hemorrhoids. Pertinent                            negatives include no palpable rectal lesions.                           Copious quantities of semi-liquid semi-solid stool                            was  found in the entire colon, precluding                            visualization. Lavage of the area was performed                            using copious amounts, resulting in incomplete                            clearance with continued poor visualization. Complications:            No immediate complications. Estimated Blood Loss:     Estimated blood loss was minimal. Impression:               - Preparation of the colon was unsatisfactory.                           - Non-thrombosed internal hemorrhoids found on                            digital rectal exam.                           - Stool in the entire examined colon.                           - No specimens collected. Recommendation:           - The patient will be observed post-procedure,                            until all discharge criteria are met.                           - Discharge patient to home.                            - Patient has a contact number available for                            emergencies. The signs and symptoms of potential                            delayed complications were discussed with the                            patient. Return to normal activities tomorrow.                            Written discharge instructions were provided to the                            patient.                           - High fiber diet.                           -  Continue present medications.                           - Repeat colonoscopy within next 1 year because the                            bowel preparation was poor. Would recommend a 2-day                            preparation prior to procedure and would also                            recommend that patient take Laxatives the week                            prior to procedure in order to optimize patient.                           - The findings and recommendations were discussed                            with the patient. Justice Britain, MD 09/05/2018 2:21:30 PM

## 2018-09-05 NOTE — Telephone Encounter (Signed)
Thank you for update. GM 

## 2018-09-05 NOTE — Progress Notes (Signed)
To PACU, VSS. Report to [email or web address] 

## 2018-09-05 NOTE — Telephone Encounter (Signed)
Called patient to check on his Covid-19 results. Patient said he was calling now about his results and that he would call me back. I gave him our fax number so they could fax over his results. He was advised to call me back so awaiting his call.

## 2018-09-07 ENCOUNTER — Telehealth: Payer: Self-pay | Admitting: *Deleted

## 2018-09-07 ENCOUNTER — Telehealth: Payer: Self-pay

## 2018-09-07 NOTE — Telephone Encounter (Signed)
Second follow up call attempt.  Reached answering machine with no identifiers.  No message left. 

## 2018-09-07 NOTE — Telephone Encounter (Signed)
Left message for pt with follow up call from his colonoscopy, let pt know that if he is doing ok, no need to call back but if he is having any problems, pain, difficulty eating or drinking or not back to his normal activities to call us back.  Also, if he is having s/s of covid 19 or dx or exposure to covid to please call.

## 2019-05-21 MED FILL — hydrOXYzine HCL 25 MG TABS: 25 | 30 days supply | Qty: 90 | Fill #0

## 2019-05-21 MED FILL — OXcarbazepine 300 MG TABS: 300 | 30 days supply | Qty: 60 | Fill #0

## 2019-05-21 MED FILL — risperiDONE 0.5 MG TABS: 0.5 | 30 days supply | Qty: 30 | Fill #0

## 2019-05-29 MED FILL — risperiDONE 0.5 MG TABS: 0.5 | 30 days supply | Qty: 30 | Fill #0

## 2019-05-29 MED FILL — OXcarbazepine 300 MG TABS: 300 | 30 days supply | Qty: 60 | Fill #0

## 2019-05-29 MED FILL — hydrOXYzine HCL 25 MG TABS: 25 | 30 days supply | Qty: 90 | Fill #0

## 2019-05-31 ENCOUNTER — Telehealth: Payer: Self-pay | Admitting: Family Medicine

## 2019-05-31 NOTE — Telephone Encounter (Signed)
Called and spoke with patient, advised that he would need to be seen for this. Appointment was moved to a sooner appointment with [employer]. Thanks!

## 2019-05-31 NOTE — Telephone Encounter (Signed)
Pt having pain in right knee. Wants to see if there is medication that can help. In addition, Pt states that Ibuprofen has not worked for him in the past.

## 2019-06-06 ENCOUNTER — Encounter: Payer: Self-pay | Admitting: Nurse Practitioner

## 2019-06-06 ENCOUNTER — Other Ambulatory Visit: Payer: Self-pay

## 2019-06-06 ENCOUNTER — Ambulatory Visit (INDEPENDENT_AMBULATORY_CARE_PROVIDER_SITE_OTHER): Payer: Medicaid Other | Admitting: Nurse Practitioner

## 2019-06-06 VITALS — BP 107/64 | HR 77 | Temp 98.8°F | Resp 16 | Ht 72.0 in | Wt 218.0 lb

## 2019-06-06 DIAGNOSIS — Z0000 Encounter for general adult medical examination without abnormal findings: Secondary | ICD-10-CM

## 2019-06-06 DIAGNOSIS — M25561 Pain in right knee: Secondary | ICD-10-CM

## 2019-06-06 DIAGNOSIS — T148XXA Other injury of unspecified body region, initial encounter: Secondary | ICD-10-CM | POA: Diagnosis not present

## 2019-06-06 DIAGNOSIS — G8929 Other chronic pain: Secondary | ICD-10-CM

## 2019-06-06 DIAGNOSIS — L299 Pruritus, unspecified: Secondary | ICD-10-CM | POA: Diagnosis not present

## 2019-06-06 DIAGNOSIS — R21 Rash and other nonspecific skin eruption: Secondary | ICD-10-CM

## 2019-06-06 DIAGNOSIS — F3113 Bipolar disorder, current episode manic without psychotic features, severe: Secondary | ICD-10-CM

## 2019-06-06 MED ORDER — TRIAMCINOLONE ACETONIDE 0.1 % EX CREA: 1.0000 "application " | TOPICAL_CREAM | Freq: Two times a day (BID) | CUTANEOUS | 0 refills | Status: DC

## 2019-06-06 MED ORDER — METHYLPREDNISOLONE 4 MG PO TBPK: ORAL_TABLET | ORAL | 0 refills | Status: AC

## 2019-06-06 MED FILL — TRIAMCINOLONE ACETONIDE 0.1: 0.1 | 10 days supply | Qty: 30 | Fill #0

## 2019-06-06 MED FILL — METHYLPREDNISOLONE 4 MG TBP: 4 | 6 days supply | Qty: 21 | Fill #0

## 2019-06-06 NOTE — Progress Notes (Signed)
Established Patient Office Visit  Subjective:  Patient ID: Andrew Kaiser, male    DOB: 1961-03-01  Age: 59 y.o. MRN: [id number]  CC:  Chief Complaint  Patient presents with  . Establish Care  . Knee Pain    right knee pain   . Rash    stomach     HPI [name] presents for follow up. He  has a past medical history of Allergy, Anemia, Anxiety, Arthritis, Bipolar affective ([place]), Depression, Memory disturbance (10/07/2014), PFO (patent foramen ovale), and Stroke ([place]).   Rash Patient presents for evaluation of a rash involving the trunk. Rash started a few weeks ago. Lesions are red, and open. Rash has not changed over time. Rash is pruritic. Associated symptoms: none. Patient denies: fever and myalgia. Patient has had contacts with similar rash. Patient has not had new exposures (soaps, lotions, laundry detergents, foods, medications, plants, insects or animals).  He feels like the rash is related to his Lamictal.  Knee Pain Patient presents with knee pain involving the right knee. Onset of the symptoms was several years ago. Inciting event: none known. Current symptoms include stiffness. Pain is aggravated by walking. Patient has had prior knee problems. Evaluation to date: none. Treatment to date: avoidance of offending activity, OTC analgesics which are not very effective and prescription NSAIDS which are not very effective.   Past Medical History:  Diagnosis Date  . Allergy   . Anemia   . Anxiety   . Arthritis    knees   . Bipolar affective ([place])   . Depression   . Memory disturbance 10/07/2014  . PFO (patent foramen ovale)   . Stroke [facility])    x3    Past Surgical History:  Procedure Laterality Date  . COLONOSCOPY    . PATENT FORAMEN OVALE CLOSURE  2006  . POLYPECTOMY    . RHINOPLASTY      Family History  Problem Relation Age of Onset  . Stroke Mother   . Heart disease Father   . Asthma Father   . Stroke Sister   . Multiple sclerosis Sister   . Colon  cancer Neg Hx   . Pancreatic cancer Neg Hx   . Rectal cancer Neg Hx   . Stomach cancer Neg Hx   . Esophageal cancer Neg Hx   . Prostate cancer Neg Hx   . Colon polyps Neg Hx     Social History   Socioeconomic History  . Marital status: Divorced    Spouse name: Not on file  . Number of children: 1  . Years of education: 68  . Highest education level: Not on file  Occupational History  . Occupation: unemployed  Tobacco Use  . Smoking status: Former Smoker    Types: Cigars  . Smokeless tobacco: Never Used  Substance and Sexual Activity  . Alcohol use: Yes    Alcohol/week: 0.0 standard drinks    Comment: socially  . Drug use: No  . Sexual activity: Not on file  Other Topics Concern  . Not on file  Social History Narrative   Patient drinks caffeine occasionally.   Patient is left handed.   Social Determinants of Health   Financial Resource Strain:   . Difficulty of Paying Living Expenses:   Food Insecurity:   . Worried About Charity fundraiser in the Last Year:   . Arboriculturist in the Last Year:   Transportation Needs:   . Film/video editor (Medical):   Marland Kitchen  Lack of Transportation (Non-Medical):   Physical Activity:   . Days of Exercise per Week:   . Minutes of Exercise per Session:   Stress:   . Feeling of Stress :   Social Connections:   . Frequency of Communication with Friends and Family:   . Frequency of Social Gatherings with Friends and Family:   . Attends Religious Services:   . Active Member of Clubs or Organizations:   . Attends Archivist Meetings:   Marland Kitchen Marital Status:   Intimate Partner Violence:   . Fear of Current or Ex-Partner:   . Emotionally Abused:   Marland Kitchen Physically Abused:   . Sexually Abused:     Outpatient Medications Prior to Visit  Medication Sig Dispense Refill  . hydrOXYzine (VISTARIL) 25 MG capsule Take 25 mg by mouth 3 (three) times daily as needed for itching.    . lamoTRIgine (LAMICTAL) 100 MG tablet Take 100 mg by  mouth daily.    . risperiDONE (RISPERDAL) 3 MG tablet Take 3 mg by mouth at bedtime.    . traZODone (DESYREL) 100 MG tablet Take 100 mg by mouth at bedtime.    . Multiple Vitamin (MULTIVITAMIN WITH MINERALS) TABS tablet Take 1 tablet by mouth daily.    . cetirizine (ZYRTEC) 10 MG tablet Take 10 mg by mouth daily.    . citalopram (CELEXA) 20 MG tablet Take 1 tablet by mouth every morning.  2  . diclofenac sodium (VOLTAREN) 1 % GEL Apply 4 g topically 4 (four) times daily as needed (for pain). (Patient not taking: Reported on 08/21/2018) 100 g 1  . Fe Asp XX123456 (MULTIGEN FOLIC) XX123456 MG TABS Take 1 tablet by mouth daily.    . fluticasone (FLONASE) 50 MCG/ACT nasal spray Place 2 sprays into both nostrils daily.    . meloxicam (MOBIC) 7.5 MG tablet Take 1 tablet (7.5 mg total) by mouth daily. (Patient not taking: Reported on 08/21/2018) 30 tablet 0  . triamcinolone cream (KENALOG) 0.1 % Apply 1 application topically 2 (two) times daily. 30 g 0  . zolpidem (AMBIEN) 10 MG tablet Take 10 mg by mouth at bedtime as needed. For insomnia      No facility-administered medications prior to visit.    No Known Allergies  ROS Review of Systems  All other systems reviewed and are negative.     Objective:    Physical Exam  Constitutional: He is oriented to person, place, and time. He appears well-developed and well-nourished. No distress.  HENT:  Head: Normocephalic.  Cardiovascular: Normal rate, regular rhythm and normal heart sounds.  Pulmonary/Chest: Breath sounds normal.  Abdominal: Soft. Bowel sounds are normal.  Musculoskeletal:        General: Tenderness present.     Cervical back: Normal range of motion.  Neurological: He is alert and oriented to person, place, and time.  Skin: Lesion and rash noted. There is erythema.     Psychiatric:  thought content questionable    BP 107/64 (BP Location: Right Arm, Patient Position: Sitting, Cuff Size: Large)   Pulse 77    Temp 98.8 F (37.1 C) (Oral)   Resp 16   Ht 6' (1.829 m)   Wt 218 lb (98.9 kg)   SpO2 98%   BMI 29.57 kg/m  Wt Readings from Last 3 Encounters:  06/06/19 218 lb (98.9 kg)  09/05/18 210 lb (95.3 kg)  08/21/18 210 lb (95.3 kg)     Health Maintenance Due  Topic Date Due  .  Hepatitis C Screening  Never done  . HIV Screening  Never done    There are no preventive care reminders to display for this patient.  Lab Results  Component Value Date   TSH 1.198 01/14/2011   Lab Results  Component Value Date   WBC 5.8 06/06/2019   HGB 14.2 06/06/2019   HCT 42.4 06/06/2019   MCV 88 06/06/2019   PLT 214 06/06/2019   Lab Results  Component Value Date   NA 143 06/06/2019   K 4.4 06/06/2019   CO2 30 10/29/2014   GLUCOSE 77 06/06/2019   BUN 23 06/06/2019   CREATININE 1.18 06/06/2019   BILITOT 0.6 06/06/2019   ALKPHOS 57 06/06/2019   AST 26 06/06/2019   ALT 19 10/29/2014   PROT 7.3 06/06/2019   ALBUMIN 4.5 06/06/2019   CALCIUM 9.3 06/06/2019   ANIONGAP 4 (L) 10/29/2014   Lab Results  Component Value Date   CHOL 140 06/06/2019   Lab Results  Component Value Date   HDL 40 06/06/2019   Lab Results  Component Value Date   LDLCALC 91 06/06/2019   Lab Results  Component Value Date   TRIG 39 06/06/2019   Lab Results  Component Value Date   CHOLHDL 3.5 06/06/2019   No results found for: HGBA1C    Assessment & Plan:   Problem List Items Addressed This Visit      Unprioritized   Bipolar disorder, current episode manic without psychotic features, severe (HCC)   Relevant Medications   lamoTRIgine (LAMICTAL) 100 MG tablet    Other Visit Diagnoses    Chronic pain of right knee    -  Primary   Images pending Steroid Dosepak pending Referral to sports medicine for further evaluation possible injection   Relevant Medications   lamoTRIgine (LAMICTAL) 100 MG tablet   methylPREDNISolone (MEDROL) 4 MG TBPK tablet   Other Relevant Orders   Ambulatory referral to  Sports Medicine   DG Knee Complete 4 Views Right   Sedimentation rate (Completed)   Skin excoriation       related to nickel belt buckle  Topical steroid provided per patient request   Relevant Medications   triamcinolone cream (KENALOG) 0.1 %   Other Relevant Orders   CBC with Differential/Platelet (Completed)   Rash       Relevant Medications   triamcinolone cream (KENALOG) 0.1 %   Pruritus       Relevant Medications   triamcinolone cream (KENALOG) 0.1 %   Healthcare maintenance       Relevant Orders   CBC with Differential/Platelet (Completed)   Comp. Metabolic Panel (12) (Completed)   Lipid panel (Completed)      Meds ordered this encounter  Medications  . triamcinolone cream (KENALOG) 0.1 %    Sig: Apply 1 application topically 2 (two) times daily.    Dispense:  30 g    Refill:  0    Order Specific Question:   Supervising Provider    Answer:   Tresa Garter [id number]  . methylPREDNISolone (MEDROL) 4 MG TBPK tablet    Sig: Follow package instructions.    Dispense:  21 tablet    Refill:  0    Do not add to the "Automatic Refill" notification system.    Order Specific Question:   Supervising Provider    Answer:   Tresa Garter [id number]    Follow-up: Return in about 6 months (around 12/07/2019).    Vevelyn Francois, NP

## 2019-06-06 NOTE — Patient Instructions (Signed)

## 2019-06-07 LAB — CBC WITH DIFFERENTIAL/PLATELET
Basophils Absolute: 0 10*3/uL (ref 0.0–0.2)
Basos: 1 %
EOS (ABSOLUTE): 0.2 10*3/uL (ref 0.0–0.4)
Eos: 3 %
Hematocrit: 42.4 % (ref 37.5–51.0)
Hemoglobin: 14.2 g/dL (ref 13.0–17.7)
Immature Grans (Abs): 0 10*3/uL (ref 0.0–0.1)
Immature Granulocytes: 0 %
Lymphocytes Absolute: 1.5 10*3/uL (ref 0.7–3.1)
Lymphs: 25 %
MCH: 29.5 pg (ref 26.6–33.0)
MCHC: 33.5 g/dL (ref 31.5–35.7)
MCV: 88 fL (ref 79–97)
Monocytes Absolute: 0.5 10*3/uL (ref 0.1–0.9)
Monocytes: 9 %
Neutrophils Absolute: 3.7 10*3/uL (ref 1.4–7.0)
Neutrophils: 62 %
Platelets: 214 10*3/uL (ref 150–450)
RBC: 4.82 x10E6/uL (ref 4.14–5.80)
RDW: 12.9 % (ref 11.6–15.4)
WBC: 5.8 10*3/uL (ref 3.4–10.8)

## 2019-06-07 LAB — COMP. METABOLIC PANEL (12)
AST: 26 IU/L (ref 0–40)
Albumin/Globulin Ratio: 1.6 (ref 1.2–2.2)
Albumin: 4.5 g/dL (ref 3.8–4.9)
Alkaline Phosphatase: 57 IU/L (ref 39–117)
BUN/Creatinine Ratio: 19 (ref 9–20)
BUN: 23 mg/dL (ref 6–24)
Bilirubin Total: 0.6 mg/dL (ref 0.0–1.2)
Calcium: 9.3 mg/dL (ref 8.7–10.2)
Chloride: 106 mmol/L (ref 96–106)
Creatinine, Ser: 1.18 mg/dL (ref 0.76–1.27)
GFR calc Af Amer: 79 mL/min/{1.73_m2} (ref >59)
GFR calc non Af Amer: 68 mL/min/{1.73_m2} (ref >59)
Globulin, Total: 2.8 g/dL (ref 1.5–4.5)
Glucose: 77 mg/dL (ref 65–99)
Potassium: 4.4 mmol/L (ref 3.5–5.2)
Sodium: 143 mmol/L (ref 134–144)
Total Protein: 7.3 g/dL (ref 6.0–8.5)

## 2019-06-07 LAB — LIPID PANEL
Chol/HDL Ratio: 3.5 ratio (ref 0.0–5.0)
Cholesterol, Total: 140 mg/dL (ref 100–199)
HDL: 40 mg/dL (ref >39)
LDL Chol Calc (NIH): 91 mg/dL (ref 0–99)
Triglycerides: 39 mg/dL (ref 0–149)
VLDL Cholesterol Cal: 9 mg/dL (ref 5–40)

## 2019-06-07 LAB — SEDIMENTATION RATE: Sed Rate: 13 mm/hr (ref 0–30)

## 2019-06-13 ENCOUNTER — Encounter: Payer: Self-pay | Admitting: Sports Medicine

## 2019-06-13 ENCOUNTER — Ambulatory Visit (INDEPENDENT_AMBULATORY_CARE_PROVIDER_SITE_OTHER): Payer: Medicaid Other | Admitting: Sports Medicine

## 2019-06-13 ENCOUNTER — Ambulatory Visit (HOSPITAL_COMMUNITY)
Admission: RE | Admit: 2019-06-13 | Discharge: 2019-06-13 | Disposition: A | Discharge status: Home / Self Care | Payer: Medicaid Other | Source: Ambulatory Visit | Attending: Nurse Practitioner | Admitting: Nurse Practitioner

## 2019-06-13 ENCOUNTER — Other Ambulatory Visit: Payer: Self-pay

## 2019-06-13 VITALS — BP 108/72 | Ht 72.0 in | Wt 218.0 lb

## 2019-06-13 DIAGNOSIS — M25561 Pain in right knee: Secondary | ICD-10-CM | POA: Diagnosis not present

## 2019-06-13 DIAGNOSIS — G8929 Other chronic pain: Secondary | ICD-10-CM | POA: Insufficient documentation

## 2019-06-13 NOTE — Progress Notes (Addendum)
   [facility] [address] [place], [place] [address] Phone: [phone] Fax: [phone]   Patient Name: Andrew Kaiser Date of Birth: [birth year] Medical Record Number: [id number] Gender: male Date of Encounter: 06/13/2019  SUBJECTIVE:      Chief Complaint: R Knee pain   HPI:  Andrew Kaiser is a 59 year old gentleman presenting with acute on chronic right knee pain.  States it has been bothering him for years, but over the last few weeks has gotten worse.  Aggravated with deep knee flexion, going up and down stairs, and walking.  Alleviated with rest.  He went to an urgent care last week where they ordered plain films, but he has not had them done.  He was given a p.o. steroid Dosepak that he just picked up yesterday.  Denies any true instability, but will have pain with walking for prolonged periods of time.     ROS:     See HPI.   PERTINENT  PMH / PSH / FH / SH:  Past Medical, Surgical, Social, and Family History Reviewed & Updated in the EMR. Pertinent findings include:  Bipolar disorder   OBJECTIVE:  BP 108/72   Ht 6' (1.829 m)   Wt 218 lb (98.9 kg)   BMI 29.57 kg/m  Physical Exam:  Vital signs are reviewed.   GEN: Alert and oriented, NAD Pulm: Breathing unlabored PSY: normal mood, congruent affect  MSK: Right knee: Normal to inspection with no erythema or effusion or obvious bony abnormalities. Mild medial joint line tenderness ROM full in flexion and extension and lower leg rotation. Ligaments with solid consistent endpoints including ACL, PCL, LCL, MCL. Positive Thessaly tests. Non painful patellar compression. Patellar glide without crepitus. Patellar and quadriceps tendons unremarkable. Hamstring and quadriceps strength is normal.  Neurovascularly intact.   ASSESSMENT & PLAN:   1. Right knee pain  Given that patient has already had the plain films ordered, I recommended he proceed with getting the images taken.  In the  interim, I have recommended a compression knee sleeve to wear when walking.  We have also given him a prescription for formal physical therapy for likely degenerative meniscus tear.  He will follow up with me in 3 weeks.   Lanier Clam, DO, ATC Sports Medicine Fellow   Addendum:  I was the preceptor for this visit and available for immediate consultation.  Karlton Lemon MD Kirt Boys

## 2019-06-14 ENCOUNTER — Telehealth: Payer: Self-pay

## 2019-06-14 NOTE — Telephone Encounter (Signed)
Called, no answer. Left a message to call back. Thanks!  

## 2019-06-14 NOTE — Telephone Encounter (Signed)
-----   Message from Vevelyn Francois, NP sent at 06/14/2019  1:27 PM EDT ----- Please make Mr. 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  Have him to follow-up with orthopedist as scheduled

## 2019-06-18 ENCOUNTER — Other Ambulatory Visit: Payer: Self-pay | Admitting: Nurse Practitioner

## 2019-06-18 MED ORDER — MELOXICAM 15 MG PO TABS: 15.0000 mg | ORAL_TABLET | Freq: Every day | ORAL | 0 refills | Status: DC

## 2019-06-18 MED FILL — MELOXICAM 15 MG TABLET: 15 | 30 days supply | Qty: 30 | Fill #0

## 2019-06-18 NOTE — Telephone Encounter (Signed)
Called and spoke with patient, advised that arthritis is present in the knee and asked that he follow up with orthopedic doctor. Patient verbalized understanding. Patient is asking if anything can be given to him by our office for pain for this knee. Please advise.

## 2019-06-18 NOTE — Telephone Encounter (Signed)
Called, no answer. Left a voicemail to call back. Thanks!  

## 2019-06-18 NOTE — Telephone Encounter (Signed)
Called and spoke with patient, advised that meloxicam 15mg  could be prescribed to help with this. He does want to try this. Can you please send in to [facility]? Thanks!

## 2019-06-18 NOTE — Telephone Encounter (Signed)
Inform him we can try meloxicam 15 mg daily.  Instructed him this is an anti-inflammatory.  This along with movement is the best treatment for arthritis

## 2019-06-19 ENCOUNTER — Ambulatory Visit: Payer: Medicaid Other | Admitting: Family Medicine

## 2019-06-19 MED FILL — TRIAMCINOLONE ACETONIDE 0.1: 0.1 | 10 days supply | Qty: 30 | Fill #0

## 2019-06-19 MED FILL — METHYLPREDNISOLONE 4 MG TBP: 4 | 6 days supply | Qty: 21 | Fill #0

## 2019-07-02 ENCOUNTER — Encounter: Payer: Self-pay | Admitting: Physical Therapy

## 2019-07-02 ENCOUNTER — Ambulatory Visit: Payer: Medicaid Other | Attending: Sports Medicine | Admitting: Physical Therapy

## 2019-07-02 ENCOUNTER — Other Ambulatory Visit: Payer: Self-pay

## 2019-07-02 DIAGNOSIS — G8929 Other chronic pain: Secondary | ICD-10-CM

## 2019-07-02 DIAGNOSIS — R2689 Other abnormalities of gait and mobility: Secondary | ICD-10-CM | POA: Diagnosis present

## 2019-07-02 DIAGNOSIS — M25561 Pain in right knee: Secondary | ICD-10-CM | POA: Insufficient documentation

## 2019-07-02 NOTE — Therapy (Addendum)
[place], [place], [address] Phone: [phone]   Fax:  [phone]  Physical Therapy Evaluation/Discharge   Patient Details  Name: Andrew Kaiser MRN: [id number] Date of Birth: 1960/07/26 Referring Provider (PT): Dr Johnnette Barrios c [place] Lor    Encounter Date: 07/02/2019  PT End of Session - 07/02/19 1142    Visit Number  1    Number of Visits  4    Date for PT Re-Evaluation  07/30/19    Authorization Type  [employer]    PT Start Time  1100    PT Stop Time  1143    PT Time Calculation (min)  43 min    Activity Tolerance  Patient tolerated treatment well    Behavior During Therapy  [facility] for tasks assessed/performed       Past Medical History:  Diagnosis Date  . Allergy   . Anemia   . Anxiety   . Arthritis    knees   . Bipolar affective ([place])   . Depression   . Memory disturbance 10/07/2014  . PFO (patent foramen ovale)   . Stroke [facility])    x3    Past Surgical History:  Procedure Laterality Date  . COLONOSCOPY    . PATENT FORAMEN OVALE CLOSURE  2006  . POLYPECTOMY    . RHINOPLASTY      There were no vitals filed for this visit.   Subjective Assessment - 07/02/19 1113    Subjective  Patient has a 7-8 year history of right knee pain. He has had increased pain recently withw walking.  He does a large amount of walking daily.    Pertinent History  bipolar, strokes,    Limitations  Standing;Walking    How long can you sit comfortably?  stiffens at times    How long can you walk comfortably?  limited distances    Diagnostic tests  X-ray: mild OA    Patient Stated Goals  to have less pain in his knee    Currently in Pain?  Yes         [facility] PT Assessment - 07/02/19 0001      Assessment   Medical Diagnosis  t    Referring Provider (PT)  Dr Johnnette Barrios c [facility]     Onset Date/Surgical Date  --   7-8 hours    Hand Dominance  Left    Next MD Visit  Nothiing scheudled     Prior Therapy  None        Precautions   Precautions  None      Restrictions   Weight Bearing Restrictions  No      Balance Screen   Has the patient fallen in the past 6 months  No    Has the patient had a decrease in activity level because of a fear of falling?   No    Is the patient reluctant to leave their home because of a fear of falling?   No      Home Environment   Additional Comments  nothing significant       Prior Function   Level of Independence  Independent    Vocation  On disability    Leisure  likes to walk       Cognition   Overall Cognitive Status  Within Functional Limits for tasks assessed    Attention  Focused    Focused Attention  Appears intact    Memory  Appears intact  Awareness  Appears intact    Problem Solving  Appears intact      Observation/Other Assessments   Focus on Therapeutic Outcomes (FOTO)   medicaid       Sensation   Light Touch  Appears Intact      Coordination   Gross Motor Movements are Fluid and Coordinated  Yes    Fine Motor Movements are Fluid and Coordinated  Yes      ROM / Strength   AROM / PROM / Strength  AROM;PROM;Strength      AROM   Overall AROM Comments  pain with end range flexion       PROM   Overall PROM Comments  pain with end range flexion       Strength   Overall Strength  Deficits    Strength Assessment Site  Knee;Hip    Right/Left Hip  Right;Left    Right Hip Flexion  4/5    Right Hip ABduction  4/5    Right Hip ADduction  4+/5    Left Hip Flexion  5/5    Left Hip ABduction  5/5    Left Hip ADduction  5/5    Right/Left Knee  Right;Left    Right Knee Flexion  4/5    Right Knee Extension  4+/5    Left Knee Flexion  5/5    Left Knee Extension  5/5      Palpation   Palpation comment  tender to palaption around the patellar tendon       Special Tests   Other special tests  valgus / berus (-)       Ambulation/Gait   Gait Comments  decreased single leg stance on the right leg; lateral movement bilateral;                  Objective measurements completed on examination: See above findings.      Early Adult PT Treatment/Exercise - 07/02/19 0001      Knee/Hip Exercises: Supine   Quad Sets Limitations  x10 5 sec hold     Bridges Limitations  10     Straight Leg Raises Limitations  x5     Other Supine Knee/Hip Exercises  supine clamshell 2x10 green              PT Education - 07/02/19 1118    Education Details  HEP and symptom    Person(s) Educated  Patient    Methods  Explanation;Demonstration;Tactile cues;Verbal cues    Comprehension  Returned demonstration;Verbal cues required;Tactile cues required;Verbalized understanding       PT Short Term Goals - 07/02/19 1155      PT SHORT TERM GOAL #1   Title  Patient will improved gross right LE strength to 4+/5    Baseline  4/5 knee extension 4/5 right hip abduction and flexion    Time  3    Period  Weeks    Status  New    Target Date  07/23/19      PT SHORT TERM GOAL #2   Title  Patient will demonstrate full passive knee flexion    Baseline  pain at end range    Time  3    Period  Weeks    Status  New    Target Date  07/23/19      PT SHORT TERM GOAL #3   Title  Patient will be independent with basic HEP    Baseline  does not have an HEP  Time  3    Period  Weeks    Status  New    Target Date  07/23/19        PT Long Term Goals - 07/02/19 1159      PT LONG TERM GOAL #1   Title  Patient will 1 mile with <2/10 pain    Baseline  pain with any distance ambualted    Time  6    Period  Weeks    Status  New    Target Date  08/13/19      PT LONG TERM GOAL #2   Title  Patient will stand for 1 hour without increase pain in order to perform ADL's    Baseline  depends on the say but pain increases with standing    Time  6    Period  Weeks    Status  New    Target Date  08/13/19             Plan - 07/02/19 1146    Clinical Impression Statement  Patient is a 59 year old male who presents with a long  history of right knee pain. His pain increases when he walks. He walks frewquently. He has strength devficits in his right hip and knee. He has tenderness to palpation in his patellar tendon area. He would benefit from skilled therapy to improve his abilit yto ambualte and perfrom ADl's.    Personal Factors and Comorbidities  Comorbidity 1;Comorbidity 2    Comorbidities  Bi-polar; history of CVAs    Examination-Activity Limitations  Bend;Lift;Squat;Stairs;Stand;Locomotion Level    Examination-Participation Restrictions  Shop;Community Activity    Stability/Clinical Decision Making  Evolving/Moderate complexity    Clinical Decision Making  Moderate    Rehab Potential  Good    PT Frequency  2x / week    PT Duration  6 weeks    PT Treatment/Interventions  ADLs/Self Care Home Management;Cryotherapy;Iontophoresis 62m/ml Dexamethasone;Moist Heat;Electrical Stimulation;Ultrasound;Gait training;Stair training;Functional mobility training;Therapeutic exercise;Therapeutic activities;Neuromuscular re-education;Patient/family education;Manual techniques;Passive range of motion;Taping    PT Next Visit Plan  Patient self reports he has a tendency to over-do it. Consider Ionto pto p-atella tendon area; review HEP; continue with hip and knee exercises. Condier standing series; consdier ultrasound to patellar endon if it dosent get any better. consdier thomas stretch and hamstring stretch    PT Home Exercise Plan  quad sets,  SLR; bridge, supine clamshell green    Consulted and Agree with Plan of Care  Patient       Patient will benefit from skilled therapeutic intervention in order to improve the following deficits and impairments:  Abnormal gait, Difficulty walking, Decreased endurance  Visit Diagnosis: Chronic pain of right knee  Other abnormalities of gait and mobility     Problem List Patient Active Problem List   Diagnosis Date Noted  . Tobacco dependence 07/18/2017  . Memory disturbance  10/07/2014  . Bipolar disorder, current episode manic without psychotic features, severe ([place] 01/17/2011  . Cerebral vascular disease 01/17/2011    Class: History of  . ALLERGIC RHINITIS 10/27/2006   PHYSICAL THERAPY DISCHARGE SUMMARY  Visits from Start of Care: 1  Current functional level related to goals / functional outcomes: Unknown patient did not return    Remaining deficits: Unknown    Education / Equipment: Unknown   Plan: Patient agrees to discharge.  Patient goals were not met. Patient is being discharged due to not returning since the last visit.  ?????  Carney Living  PT DPT  07/02/2019, 12:20 PM  [facility] [address] [place], [place], [address] Phone: [phone]   Fax:  [phone]  Name: KINNETH FUJIWARA MRN: [id number] Date of Birth: January 05, 1961

## 2019-07-02 NOTE — Addendum Note (Signed)
Addended by: Carney Living on: 07/02/2019 12:21 PM   Modules accepted: Orders

## 2019-07-11 ENCOUNTER — Ambulatory Visit: Payer: Medicaid Other | Admitting: Physical Therapy

## 2019-07-11 ENCOUNTER — Telehealth: Payer: Self-pay | Admitting: Physical Therapy

## 2019-07-11 ENCOUNTER — Encounter: Payer: Self-pay | Admitting: Physical Therapy

## 2019-07-11 NOTE — Telephone Encounter (Signed)
Contacted patient regarding no-show appointment. No answer. Therapy left message advising patient of next visit and advised to call if he can not make it.

## 2019-07-20 ENCOUNTER — Telehealth: Payer: Self-pay | Admitting: Physical Therapy

## 2019-07-20 ENCOUNTER — Ambulatory Visit: Payer: Medicaid Other | Attending: Sports Medicine | Admitting: Physical Therapy

## 2019-07-20 NOTE — Telephone Encounter (Signed)
Patient contacted regarding 2nd consecutive no-show appointment. Per policy all further appointments canceled. Patient given clinic number and advised to call back if he plans on continuing. He can schedule 1 visit at a time per policy.

## 2019-07-27 ENCOUNTER — Encounter: Payer: Medicaid Other | Admitting: Physical Therapy

## 2019-07-31 ENCOUNTER — Ambulatory Visit: Payer: Medicaid Other | Admitting: Physical Therapy

## 2019-08-01 MED FILL — hydrOXYzine HCL 25 MG TABS: 25 | 30 days supply | Qty: 90 | Fill #0

## 2019-08-01 MED FILL — risperiDONE 0.5 MG TABS: 0.5 | 30 days supply | Qty: 30 | Fill #0

## 2019-08-01 MED FILL — OXcarbazepine 300 MG TABS: 300 | 30 days supply | Qty: 60 | Fill #0

## 2019-08-30 ENCOUNTER — Ambulatory Visit: Payer: Medicaid Other | Attending: Internal Medicine

## 2019-08-30 DIAGNOSIS — Z23 Encounter for immunization: Secondary | ICD-10-CM

## 2019-08-30 NOTE — Progress Notes (Signed)
   Covid-19 Vaccination Clinic  Name:  Andrew Kaiser    MRN: [id number] DOB: February 19, 1961  08/30/2019  [name] was observed post Covid-19 immunization for 15 minutes without incident. He was provided with Vaccine Information Sheet and instruction to access the V-Safe system.   [name] was instructed to call 911 with any severe reactions post vaccine: Marland Kitchen Difficulty breathing  . Swelling of face and throat  . A fast heartbeat  . A bad rash all over body  . Dizziness and weakness   Immunizations Administered    Name Date Dose VIS Date Route   Pfizer COVID-19 Vaccine 08/30/2019 10:10 AM 0.3 mL 05/09/2018 Intramuscular   Manufacturer: [employer]   Lot: [id number]   [place]: [id number]

## 2019-09-20 ENCOUNTER — Ambulatory Visit: Payer: Medicaid Other | Attending: Internal Medicine

## 2019-09-20 DIAGNOSIS — Z23 Encounter for immunization: Secondary | ICD-10-CM

## 2019-09-20 NOTE — Progress Notes (Signed)
   Covid-19 Vaccination Clinic  Name:  NIVIN BRANIFF    MRN: [id number] DOB: 08/31/1960  09/20/2019  Mr. Stinnette was observed post Covid-19 immunization for 15 minutes without incident. He was provided with Vaccine Information Sheet and instruction to access the V-Safe system.   Mr. Carmer was instructed to call 911 with any severe reactions post vaccine: Marland Kitchen Difficulty breathing  . Swelling of face and throat  . A fast heartbeat  . A bad rash all over body  . Dizziness and weakness   Immunizations Administered    Name Date Dose VIS Date Route   Pfizer COVID-19 Vaccine 09/20/2019  9:53 AM 0.3 mL 05/09/2018 Intramuscular   Manufacturer: [place]   Lot: [id number]   [place]: [id number]

## 2019-10-24 ENCOUNTER — Other Ambulatory Visit: Payer: Self-pay

## 2019-10-24 MED FILL — OXcarbazepine 300 MG TABS: 300 | 30 days supply | Qty: 60 | Fill #0

## 2019-10-24 MED FILL — hydrOXYzine HCL 25 MG TABS: 25 | 30 days supply | Qty: 90 | Fill #0

## 2019-10-24 MED FILL — risperiDONE 0.5 MG TABS: 0.5 | 30 days supply | Qty: 30 | Fill #0

## 2019-11-05 ENCOUNTER — Ambulatory Visit: Payer: Medicaid Other | Attending: Family Medicine

## 2019-11-05 ENCOUNTER — Other Ambulatory Visit: Payer: Self-pay

## 2019-12-05 ENCOUNTER — Ambulatory Visit: Payer: Medicaid Other | Admitting: Nurse Practitioner

## 2019-12-06 ENCOUNTER — Ambulatory Visit (INDEPENDENT_AMBULATORY_CARE_PROVIDER_SITE_OTHER): Payer: Medicaid Other | Admitting: Nurse Practitioner

## 2019-12-06 ENCOUNTER — Encounter: Payer: Self-pay | Admitting: Nurse Practitioner

## 2019-12-06 ENCOUNTER — Other Ambulatory Visit: Payer: Self-pay

## 2019-12-06 VITALS — BP 117/73 | HR 78 | Temp 97.1°F | Ht 72.0 in | Wt 213.0 lb

## 2019-12-06 DIAGNOSIS — G4701 Insomnia due to medical condition: Secondary | ICD-10-CM | POA: Diagnosis not present

## 2019-12-06 DIAGNOSIS — F3113 Bipolar disorder, current episode manic without psychotic features, severe: Secondary | ICD-10-CM | POA: Diagnosis not present

## 2019-12-06 NOTE — Patient Instructions (Signed)

## 2019-12-06 NOTE — Progress Notes (Signed)
[address], [place]  [address] Phone:  [phone]   Fax:  [phone]   Established Patient Office Visit  Subjective:  Patient ID: Andrew Kaiser, male    DOB: 11/13/1960  Age: 59 y.o. MRN: [id number]  CC:  Chief Complaint  Patient presents with  . Follow-up    no sleep well at night started several months ago    HPI Andrew Kaiser presents for follow up. He  has a past medical history of Allergy, Anemia, Anxiety, Arthritis, Bipolar affective ([place]), Depression, Memory disturbance (10/07/2014), PFO (patent foramen ovale), and Stroke ([place]).    Andrew Kaiser is a 59 y.o. male who complains of insomnia. Onset was several months ago. Patient describes symptoms as frequent night time awakening. Patient has found no relief with going to sleep at the same time each night and prescription sleep aid, trazadone. Associated symptoms include: daytime somnolence. Patient denies frequent nighttime urination, irritability, leg cramps, restless legs and snoring. Symptoms have gradually worsened. He works part-time and he takes a 3 pm everyday. He has requested Ambien. He admits that he was on this in the past. He is not hydroxyzine. He admits that he lays in his lazy boy.      Past Medical History:  Diagnosis Date  . Allergy   . Anemia   . Anxiety   . Arthritis    knees   . Bipolar affective ([place])   . Depression   . Memory disturbance 10/07/2014  . PFO (patent foramen ovale)   . Stroke [facility])    x3    Past Surgical History:  Procedure Laterality Date  . COLONOSCOPY    . PATENT FORAMEN OVALE CLOSURE  2006  . POLYPECTOMY    . RHINOPLASTY      Family History  Problem Relation Age of Onset  . Stroke Mother   . Heart disease Father   . Asthma Father   . Stroke Sister   . Multiple sclerosis Sister   . Colon cancer Neg Hx   . Pancreatic cancer Neg Hx   . Rectal cancer Neg Hx   . Stomach cancer Neg Hx   . Esophageal cancer Neg Hx   .  Prostate cancer Neg Hx   . Colon polyps Neg Hx     Social History   Socioeconomic History  . Marital status: Divorced    Spouse name: Not on file  . Number of children: 1  . Years of education: 32  . Highest education level: Not on file  Occupational History  . Occupation: unemployed  Tobacco Use  . Smoking status: Former Smoker    Types: Cigars  . Smokeless tobacco: Never Used  Vaping Use  . Vaping Use: Never used  Substance and Sexual Activity  . Alcohol use: Yes    Alcohol/week: 0.0 standard drinks    Comment: socially  . Drug use: No  . Sexual activity: Not on file  Other Topics Concern  . Not on file  Social History Narrative   Patient drinks caffeine occasionally.   Patient is left handed.   Social Determinants of Health   Financial Resource Strain:   . Difficulty of Paying Living Expenses: Not on file  Food Insecurity:   . Worried About Charity fundraiser in the Last Year: Not on file  . Ran Out of Food in the Last Year: Not on file  Transportation Needs:   . Lack of Transportation (Medical): Not  on file  . Lack of Transportation (Non-Medical): Not on file  Physical Activity:   . Days of Exercise per Week: Not on file  . Minutes of Exercise per Session: Not on file  Stress:   . Feeling of Stress : Not on file  Social Connections:   . Frequency of Communication with Friends and Family: Not on file  . Frequency of Social Gatherings with Friends and Family: Not on file  . Attends Religious Services: Not on file  . Active Member of Clubs or Organizations: Not on file  . Attends Archivist Meetings: Not on file  . Marital Status: Not on file  Intimate Partner Violence:   . Fear of Current or Ex-Partner: Not on file  . Emotionally Abused: Not on file  . Physically Abused: Not on file  . Sexually Abused: Not on file    Outpatient Medications Prior to Visit  Medication Sig Dispense Refill  . hydrOXYzine (VISTARIL) 25 MG capsule Take 25 mg by  mouth 3 (three) times daily as needed for itching.    . meloxicam (MOBIC) 15 MG tablet Take 1 tablet (15 mg total) by mouth daily. 30 tablet 0  . Oxcarbazepine (TRILEPTAL) 300 MG tablet Take 300 mg by mouth 2 (two) times daily.    . risperiDONE (RISPERDAL) 3 MG tablet Take 3 mg by mouth at bedtime.    . lamoTRIgine (LAMICTAL) 100 MG tablet Take 100 mg by mouth daily. (Patient not taking: Reported on 12/06/2019)    . traZODone (DESYREL) 100 MG tablet Take 100 mg by mouth at bedtime. (Patient not taking: Reported on 12/06/2019)    . triamcinolone cream (KENALOG) 0.1 % Apply 1 application topically 2 (two) times daily. (Patient not taking: Reported on 12/06/2019) 30 g 0   No facility-administered medications prior to visit.    No Known Allergies  ROS Review of Systems  All other systems reviewed and are negative.     Objective:    Physical Exam HENT:     Head: Normocephalic and atraumatic.     Nose: Nose normal.     Mouth/Throat:     Mouth: Mucous membranes are moist.  Cardiovascular:     Rate and Rhythm: Normal rate and regular rhythm.     Pulses: Normal pulses.     Heart sounds: Normal heart sounds.  Pulmonary:     Effort: Pulmonary effort is normal.     Breath sounds: Normal breath sounds.  Abdominal:     General: Bowel sounds are normal.     Palpations: Abdomen is soft.  Musculoskeletal:        General: Normal range of motion.     Cervical back: Normal range of motion.  Skin:    General: Skin is warm and dry.     Capillary Refill: Capillary refill takes less than 2 seconds.  Neurological:     General: No focal deficit present.     Mental Status: He is alert and oriented to person, place, and time.  Psychiatric:        Mood and Affect: Mood normal.        Behavior: Behavior normal.        Thought Content: Thought content normal.        Judgment: Judgment normal.     BP 117/73   Pulse 78   Temp (!) 97.1 F (36.2 C) (Temporal)   Ht 6' (1.829 m)   Wt 213 lb (96.6  kg)   SpO2 99%  BMI 28.89 kg/m  Wt Readings from Last 3 Encounters:  12/06/19 213 lb (96.6 kg)  06/13/19 218 lb (98.9 kg)  06/06/19 218 lb (98.9 kg)     Health Maintenance Due  Topic Date Due  . Hepatitis C Screening  Never done  . HIV Screening  Never done  . COLONOSCOPY  09/05/2019  . INFLUENZA VACCINE  Never done    There are no preventive care reminders to display for this patient.  Lab Results  Component Value Date   TSH 1.198 01/14/2011   Lab Results  Component Value Date   WBC 5.8 06/06/2019   HGB 14.2 06/06/2019   HCT 42.4 06/06/2019   MCV 88 06/06/2019   PLT 214 06/06/2019   Lab Results  Component Value Date   NA 143 06/06/2019   K 4.4 06/06/2019   CO2 30 10/29/2014   GLUCOSE 77 06/06/2019   BUN 23 06/06/2019   CREATININE 1.18 06/06/2019   BILITOT 0.6 06/06/2019   ALKPHOS 57 06/06/2019   AST 26 06/06/2019   ALT 19 10/29/2014   PROT 7.3 06/06/2019   ALBUMIN 4.5 06/06/2019   CALCIUM 9.3 06/06/2019   ANIONGAP 4 (L) 10/29/2014   Lab Results  Component Value Date   CHOL 140 06/06/2019   Lab Results  Component Value Date   HDL 40 06/06/2019   Lab Results  Component Value Date   LDLCALC 91 06/06/2019   Lab Results  Component Value Date   TRIG 39 06/06/2019   Lab Results  Component Value Date   CHOLHDL 3.5 06/06/2019   No results found for: HGBA1C    Assessment & Plan:   Problem List Items Addressed This Visit      Other   Bipolar disorder, current episode manic without psychotic features, severe ([place]) - Primary Continue with current medication regimen and follow up with therapist as scheduled    Other Visit Diagnoses    Insomnia due to medical condition     We discussed at length sleep hygiene measures including regular sleep schedule, optimal sleep environment, and relaxing presleep rituals. Avoid daily 3 pm nap Avoid caffeine after noon. Avoid excess alcohol. Avoid tobacco. Recommended daily exercise        No orders of  the defined types were placed in this encounter.   Follow-up: Return in about 6 months (around 06/04/2020).    Vevelyn Francois, NP Tramadol

## 2019-12-07 ENCOUNTER — Ambulatory Visit: Payer: Medicaid Other | Admitting: Nurse Practitioner

## 2020-01-02 ENCOUNTER — Other Ambulatory Visit: Payer: Self-pay

## 2020-01-02 MED FILL — hydrOXYzine HCL 25 MG TABS: 25 | 30 days supply | Qty: 150 | Fill #0

## 2020-01-02 MED FILL — risperiDONE 0.5 MG TABS: 0.5 | 90 days supply | Qty: 90 | Fill #0

## 2020-01-02 MED FILL — OXcarbazepine 300 MG TABS: 300 | 90 days supply | Qty: 180 | Fill #0

## 2020-01-22 MED FILL — risperiDONE 0.5 MG TABS: 0.5 | 90 days supply | Qty: 90 | Fill #0

## 2020-01-22 MED FILL — hydrOXYzine HCL 25 MG TABS: 25 | 30 days supply | Qty: 150 | Fill #0

## 2020-01-22 MED FILL — OXcarbazepine 300 MG TABS: 300 | 90 days supply | Qty: 180 | Fill #0

## 2020-03-13 ENCOUNTER — Other Ambulatory Visit: Payer: Self-pay

## 2020-03-13 ENCOUNTER — Ambulatory Visit (HOSPITAL_COMMUNITY)
Admission: EM | Admit: 2020-03-13 | Discharge: 2020-03-13 | Disposition: A | Discharge status: Home / Self Care | Payer: Medicaid Other | Attending: Family Medicine | Admitting: Family Medicine

## 2020-03-13 DIAGNOSIS — Z20822 Contact with and (suspected) exposure to covid-19: Secondary | ICD-10-CM | POA: Insufficient documentation

## 2020-03-13 NOTE — ED Triage Notes (Signed)
Pt in requesting covid testing  Denies any covid sxs

## 2020-03-14 LAB — SARS CORONAVIRUS 2 (TAT 6-24 HRS): SARS Coronavirus 2: NEGATIVE

## 2020-03-24 ENCOUNTER — Ambulatory Visit: Payer: Medicaid Other

## 2020-03-25 ENCOUNTER — Ambulatory Visit: Payer: Medicaid Other | Attending: Internal Medicine

## 2020-03-25 DIAGNOSIS — Z23 Encounter for immunization: Secondary | ICD-10-CM

## 2020-03-25 NOTE — Progress Notes (Signed)
   Covid-19 Vaccination Clinic  Name:  Andrew Kaiser    MRN: [id number] DOB: 11/30/1960  03/25/2020  Mr. Doucet was observed post Covid-19 immunization for 15 minutes without incident. He was provided with Vaccine Information Sheet and instruction to access the V-Safe system.   Mr. Smyers was instructed to call 911 with any severe reactions post vaccine: Marland Kitchen Difficulty breathing  . Swelling of face and throat  . A fast heartbeat  . A bad rash all over body  . Dizziness and weakness   Immunizations Administered    Name Date Dose VIS Date Route   Pfizer COVID-19 Vaccine 03/25/2020  2:54 PM 0.3 mL 01/02/2020 Intramuscular   Manufacturer: [employer]   Lot: [id number]   [place]: [address]-6

## 2020-03-26 ENCOUNTER — Other Ambulatory Visit: Payer: Self-pay

## 2020-03-27 MED FILL — hydrOXYzine HCL 25 MG TABS: 25 | 30 days supply | Qty: 90 | Fill #1

## 2020-04-03 ENCOUNTER — Other Ambulatory Visit: Payer: Self-pay

## 2020-04-04 MED FILL — hydrOXYzine HCL 25 MG TABS: 25 | 30 days supply | Qty: 150 | Fill #0

## 2020-04-04 MED FILL — MIRTAZAPINE 15 MG TABLET: 15 | 30 days supply | Qty: 30 | Fill #0

## 2020-04-21 MED FILL — OXcarbazepine 300 MG TABS: 300 | 30 days supply | Qty: 60 | Fill #0

## 2020-04-21 MED FILL — risperiDONE 0.5 MG TABS: 0.5 | 30 days supply | Qty: 30 | Fill #0

## 2020-04-21 MED FILL — hydrOXYzine HCL 25 MG TABS: 25 | 30 days supply | Qty: 90 | Fill #2

## 2020-05-07 ENCOUNTER — Other Ambulatory Visit: Payer: Self-pay

## 2020-05-07 ENCOUNTER — Other Ambulatory Visit: Payer: Medicaid Other

## 2020-05-07 DIAGNOSIS — Z20822 Contact with and (suspected) exposure to covid-19: Secondary | ICD-10-CM

## 2020-05-08 LAB — NOVEL CORONAVIRUS, NAA: SARS-CoV-2, NAA: NOT DETECTED

## 2020-05-08 LAB — SARS-COV-2, NAA 2 DAY TAT

## 2020-06-05 ENCOUNTER — Ambulatory Visit: Payer: Medicaid Other | Admitting: Nurse Practitioner

## 2020-07-04 ENCOUNTER — Other Ambulatory Visit: Payer: Self-pay

## 2020-07-04 MED FILL — Hydroxyzine HCl Tab 25 MG: ORAL | 30 days supply | Qty: 150 | Fill #0 | Status: CN

## 2020-07-04 MED FILL — Oxcarbazepine Tab 300 MG: ORAL | 30 days supply | Qty: 60 | Fill #0 | Status: CN

## 2020-07-08 ENCOUNTER — Other Ambulatory Visit: Payer: Self-pay

## 2020-07-15 ENCOUNTER — Other Ambulatory Visit: Payer: Self-pay

## 2020-07-16 ENCOUNTER — Other Ambulatory Visit: Payer: Self-pay

## 2020-07-16 MED FILL — Hydroxyzine HCl Tab 25 MG: ORAL | 30 days supply | Qty: 150 | Fill #0 | Status: AC

## 2020-07-16 MED FILL — Oxcarbazepine Tab 300 MG: ORAL | 30 days supply | Qty: 60 | Fill #0 | Status: AC

## 2020-07-17 ENCOUNTER — Other Ambulatory Visit: Payer: Self-pay

## 2020-07-17 MED FILL — Risperidone Tab 0.5 MG: ORAL | 30 days supply | Qty: 30 | Fill #0 | Status: AC

## 2020-07-18 ENCOUNTER — Other Ambulatory Visit: Payer: Self-pay

## 2020-07-30 IMAGING — DX DG KNEE COMPLETE 4+V*R*
4 series · 4 of 4 positions shown · non-contrast
Comparison: None.

CLINICAL DATA: Chronic right knee pain. Acute on chronic pain. Pain
for years, worse over the past few weeks.

EXAM:
RIGHT KNEE - COMPLETE 4+ VIEW

[knee ap]
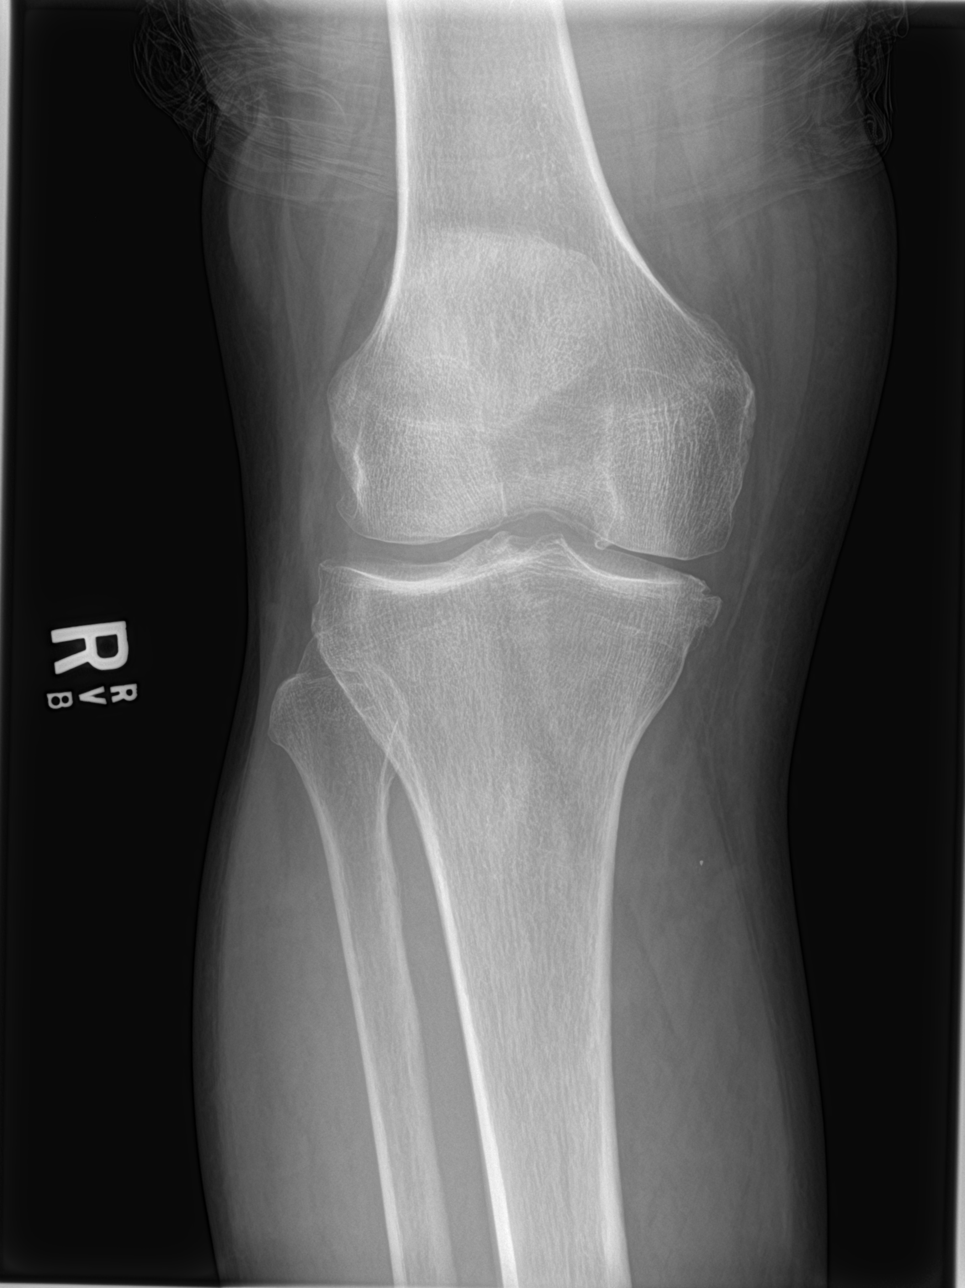

[knee lat]
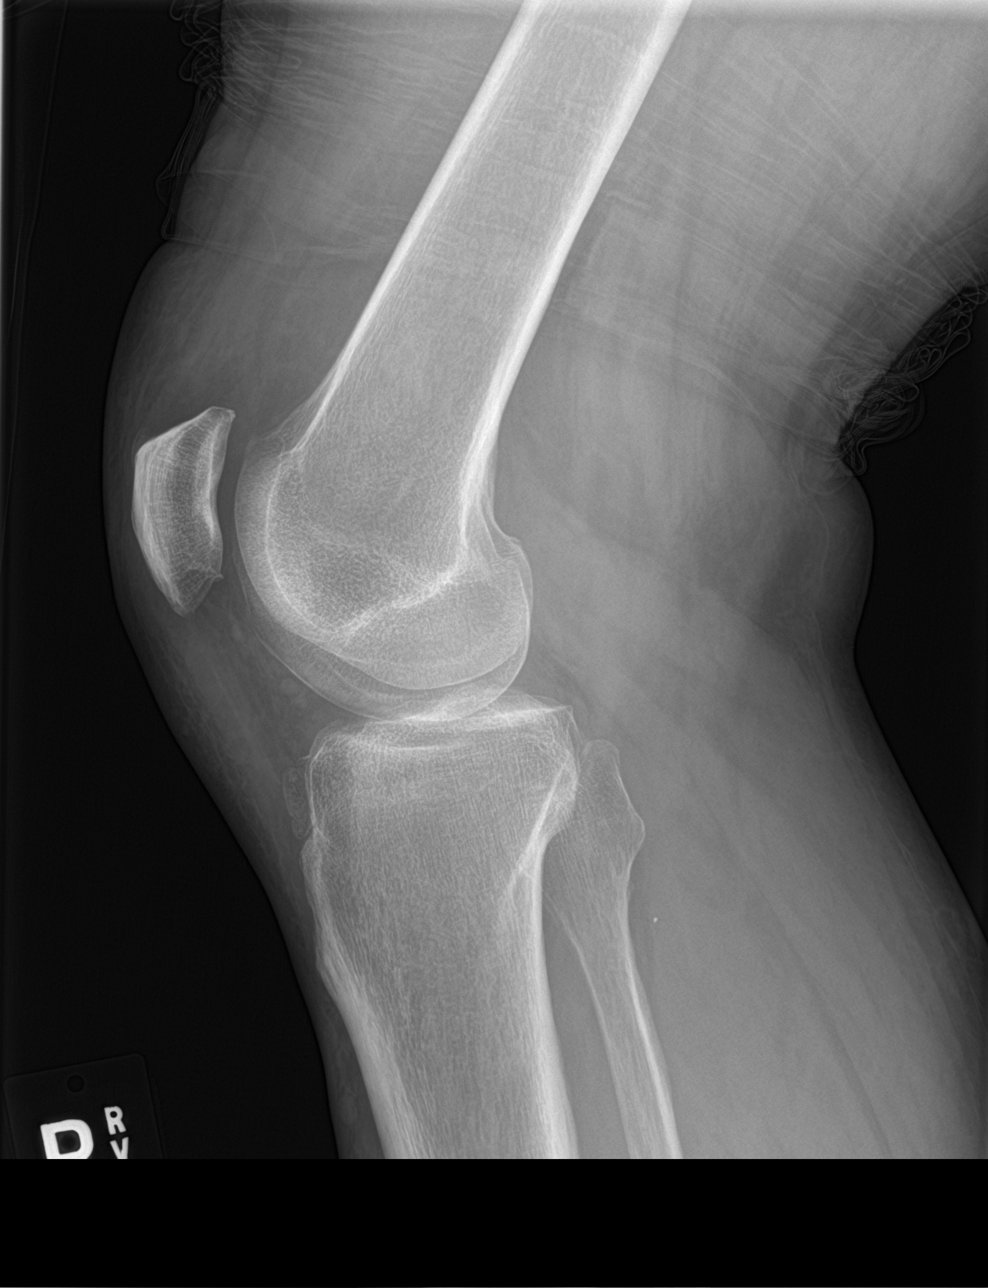

[knee obl (1 of 2)]
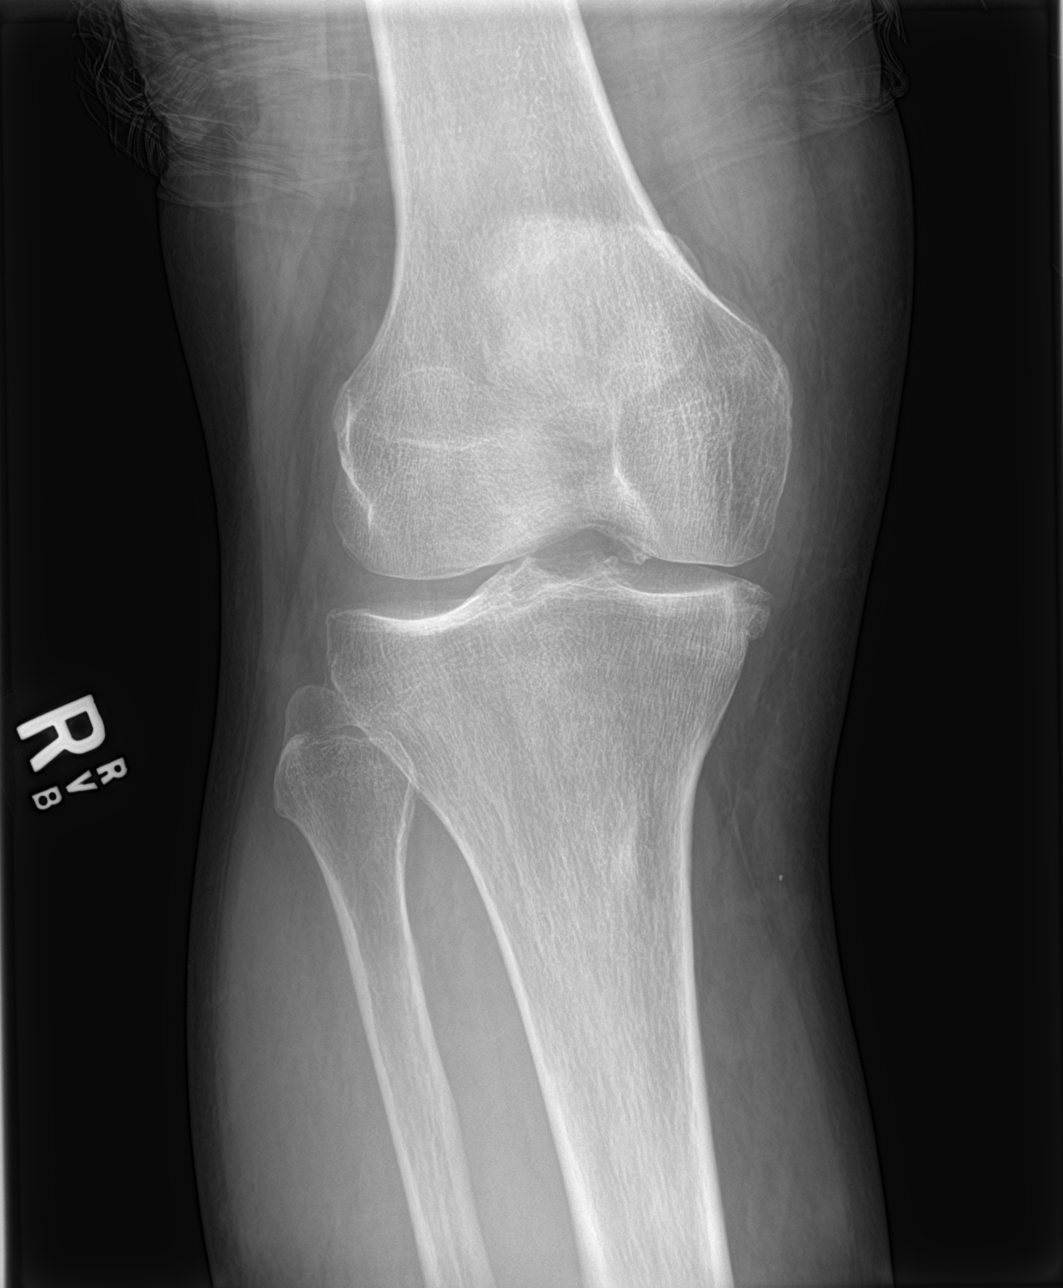

[knee obl (2 of 2)]
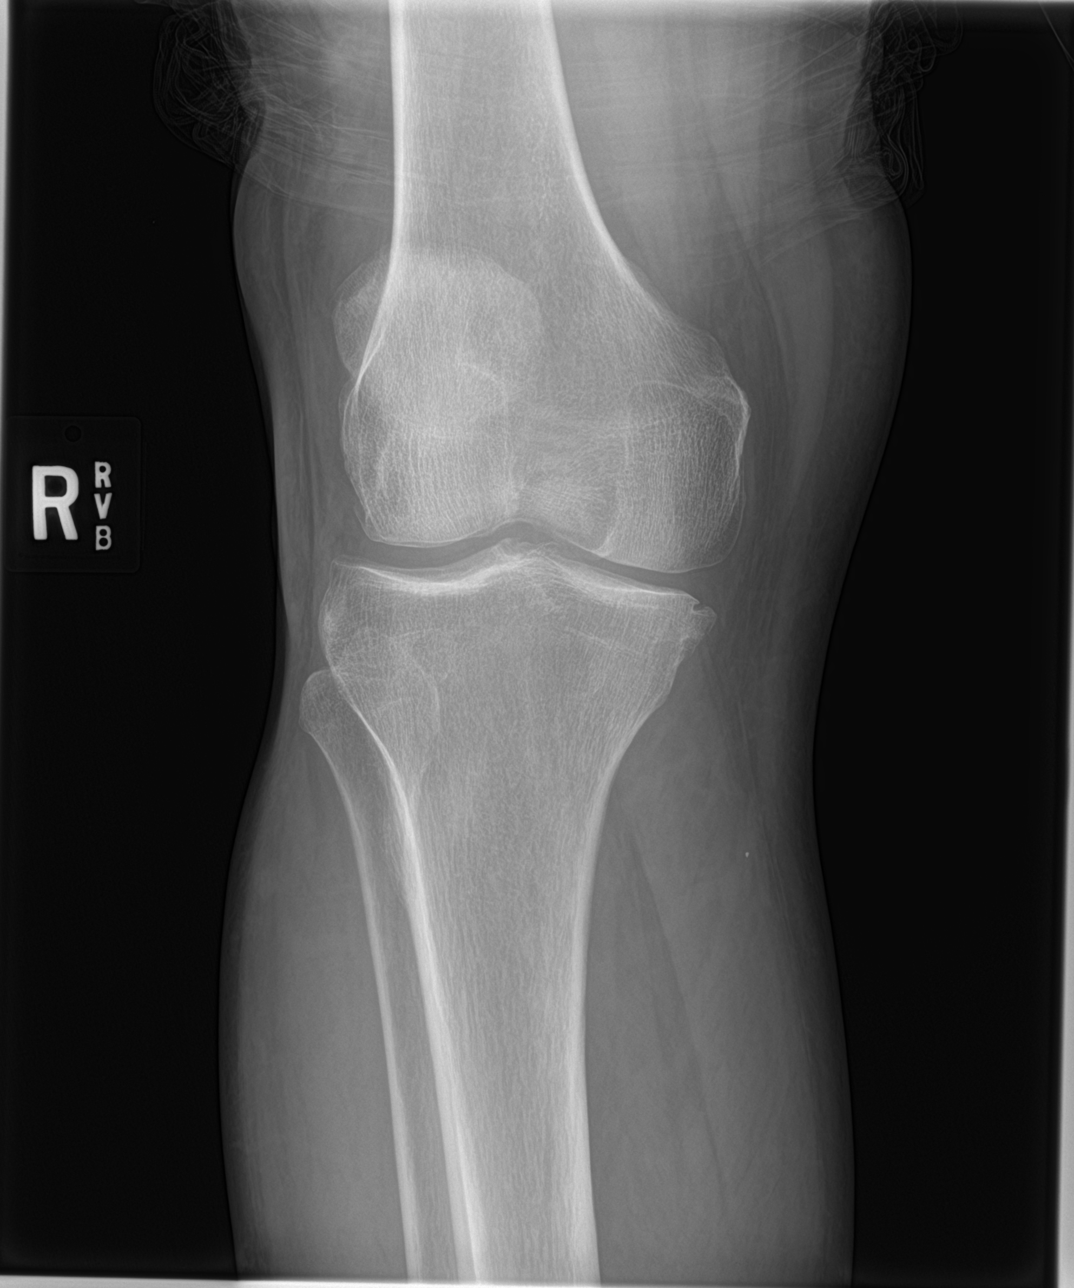

[4 of 4 positions shown; findings below may reference images not displayed]

FINDINGS: Mild tricompartmental peripheral spurring. Mild medial tibiofemoral
joint space narrowing. No fracture, dislocation or bony destruction.
No erosion or periosteal reaction. No focal bone lesion. No knee
joint effusion. Punctate density in the subcutaneous tissues may be
a tiny foreign body or a calcification.
IMPRESSION: Mild tricompartmental osteoarthritis. No acute bony abnormality.

## 2021-04-03 ENCOUNTER — Other Ambulatory Visit: Payer: Self-pay

## 2021-04-22 ENCOUNTER — Other Ambulatory Visit: Payer: Self-pay

## 2021-04-23 ENCOUNTER — Other Ambulatory Visit (HOSPITAL_BASED_OUTPATIENT_CLINIC_OR_DEPARTMENT_OTHER): Payer: Self-pay

## 2021-05-04 ENCOUNTER — Encounter (HOSPITAL_COMMUNITY): Payer: Self-pay

## 2021-05-04 ENCOUNTER — Ambulatory Visit (HOSPITAL_COMMUNITY)
Admission: EM | Admit: 2021-05-04 | Discharge: 2021-05-04 | Disposition: A | Discharge status: Home / Self Care | Payer: Medicaid Other

## 2021-05-04 NOTE — ED Notes (Signed)
Patient is being discharged from the Urgent Care and sent to the Emergency Department via POV . Per Dr. Windy Carina, patient is in need of higher level of care due to suicidal ideation. Patient is aware and verbalizes understanding of plan of care.  Vitals:   05/04/21 1038  BP: 131/77  Pulse: 93  Resp: 17  SpO2: 98%
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
## 2021-05-05 ENCOUNTER — Other Ambulatory Visit: Payer: Self-pay

## 2021-05-05 ENCOUNTER — Ambulatory Visit (HOSPITAL_COMMUNITY)
Admission: RE | Admit: 2021-05-05 | Discharge: 2021-05-05 | Disposition: A | Discharge status: Home / Self Care | Payer: Medicaid Other | Attending: Psychiatry | Admitting: Psychiatry
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
## 2021-11-02 ENCOUNTER — Other Ambulatory Visit: Payer: Self-pay

## 2021-11-02 ENCOUNTER — Encounter (HOSPITAL_COMMUNITY): Payer: Self-pay

## 2021-11-02 ENCOUNTER — Emergency Department (HOSPITAL_COMMUNITY)
Admission: EM | Admit: 2021-11-02 | Discharge: 2021-11-04 | Disposition: A | Discharge status: Other Acute Inpatient Hospital | Payer: Medicaid Other | Attending: Emergency Medicine | Admitting: Emergency Medicine

## 2021-11-02 DIAGNOSIS — S0121XA Laceration without foreign body of nose, initial encounter: Secondary | ICD-10-CM | POA: Diagnosis not present

## 2021-11-02 DIAGNOSIS — Z20822 Contact with and (suspected) exposure to covid-19: Secondary | ICD-10-CM | POA: Diagnosis not present

## 2021-11-02 DIAGNOSIS — F17200 Nicotine dependence, unspecified, uncomplicated: Secondary | ICD-10-CM | POA: Diagnosis not present

## 2021-11-02 DIAGNOSIS — Z046 Encounter for general psychiatric examination, requested by authority: Secondary | ICD-10-CM | POA: Diagnosis present

## 2021-11-02 DIAGNOSIS — F23 Brief psychotic disorder: Secondary | ICD-10-CM

## 2021-11-02 DIAGNOSIS — Z79899 Other long term (current) drug therapy: Secondary | ICD-10-CM | POA: Diagnosis not present

## 2021-11-02 DIAGNOSIS — F3113 Bipolar disorder, current episode manic without psychotic features, severe: Secondary | ICD-10-CM | POA: Diagnosis not present

## 2021-11-02 DIAGNOSIS — F29 Unspecified psychosis not due to a substance or known physiological condition: Secondary | ICD-10-CM | POA: Diagnosis not present

## 2021-11-02 DIAGNOSIS — F312 Bipolar disorder, current episode manic severe with psychotic features: Secondary | ICD-10-CM

## 2021-11-02 LAB — COMPREHENSIVE METABOLIC PANEL
ALT: 41 U/L (ref 0–44)
AST: 77 U/L — ABNORMAL HIGH (ref 15–41)
Albumin: 3.5 g/dL (ref 3.5–5.0)
Alkaline Phosphatase: 39 U/L (ref 38–126)
Anion gap: 11 (ref 5–15)
BUN: 9 mg/dL (ref 6–20)
CO2: 26 mmol/L (ref 22–32)
Calcium: 8.6 mg/dL — ABNORMAL LOW (ref 8.9–10.3)
Chloride: 101 mmol/L (ref 98–111)
Creatinine, Ser: 1.07 mg/dL (ref 0.61–1.24)
GFR, Estimated: >60 mL/min (ref >60)
Glucose, Bld: 87 mg/dL (ref 70–99)
Potassium: 3.1 mmol/L — ABNORMAL LOW (ref 3.5–5.1)
Sodium: 138 mmol/L (ref 135–145)
Total Bilirubin: 0.6 mg/dL (ref 0.3–1.2)
Total Protein: 6.6 g/dL (ref 6.5–8.1)

## 2021-11-02 LAB — RESP PANEL BY RT-PCR (FLU A&B, COVID) ARPGX2
Influenza A by PCR: NEGATIVE
Influenza B by PCR: NEGATIVE
SARS Coronavirus 2 by RT PCR: NEGATIVE

## 2021-11-02 LAB — CBC
HCT: 43.7 % (ref 39.0–52.0)
Hemoglobin: 14.8 g/dL (ref 13.0–17.0)
MCH: 30.8 pg (ref 26.0–34.0)
MCHC: 33.9 g/dL (ref 30.0–36.0)
MCV: 90.9 fL (ref 80.0–100.0)
Platelets: 269 10*3/uL (ref 150–400)
RBC: 4.81 MIL/uL (ref 4.22–5.81)
RDW: 13.9 % (ref 11.5–15.5)
WBC: 8.6 10*3/uL (ref 4.0–10.5)
nRBC: 0 % (ref 0.0–0.2)

## 2021-11-02 LAB — RAPID URINE DRUG SCREEN, HOSP PERFORMED
Amphetamines: NOT DETECTED
Barbiturates: NOT DETECTED
Benzodiazepines: NOT DETECTED
Cocaine: NOT DETECTED
Opiates: NOT DETECTED
Tetrahydrocannabinol: POSITIVE — AB

## 2021-11-02 LAB — SALICYLATE LEVEL: Salicylate Lvl: <7 mg/dL — ABNORMAL LOW (ref 7.0–30.0)

## 2021-11-02 LAB — ETHANOL: Alcohol, Ethyl (B): 136 mg/dL — ABNORMAL HIGH (ref <10)

## 2021-11-02 LAB — ACETAMINOPHEN LEVEL: Acetaminophen (Tylenol), Serum: <10 ug/mL — ABNORMAL LOW (ref 10–30)

## 2021-11-02 MED ORDER — RISPERIDONE 1 MG PO TBDP
1.0000 mg | ORAL_TABLET | Freq: Every day | ORAL | Status: DC
  Administered 2021-11-02 – 2021-11-03 (×2): 1 mg via ORAL
  Filled 2021-11-02 (×2): qty 1

## 2021-11-02 MED ORDER — HYDROXYZINE HCL 25 MG PO TABS
25.0000 mg | ORAL_TABLET | Freq: Three times a day (TID) | ORAL | Status: DC | PRN
  Filled 2021-11-02: qty 1

## 2021-11-02 MED ORDER — OXCARBAZEPINE 150 MG PO TABS
150.0000 mg | ORAL_TABLET | Freq: Two times a day (BID) | ORAL | Status: DC
  Administered 2021-11-02 – 2021-11-03 (×3): 150 mg via ORAL
  Filled 2021-11-02 (×5): qty 1

## 2021-11-02 MED ORDER — RISPERIDONE 1 MG PO TBDP
2.0000 mg | ORAL_TABLET | Freq: Three times a day (TID) | ORAL | Status: DC | PRN
  Administered 2021-11-02: 2 mg via ORAL
  Filled 2021-11-02: qty 2

## 2021-11-02 MED ORDER — LORAZEPAM 1 MG PO TABS
1.0000 mg | ORAL_TABLET | ORAL | Status: AC | PRN
  Administered 2021-11-02: 1 mg via ORAL
  Filled 2021-11-02: qty 1

## 2021-11-02 MED ORDER — ZIPRASIDONE MESYLATE 20 MG IM SOLR: 20.0000 mg | Freq: Two times a day (BID) | INTRAMUSCULAR | Status: DC | PRN
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
## 2021-11-02 NOTE — ED Notes (Addendum)
Sleeping in chair. Pt moved from triage to purple 48, pending TTS. Calm and cooperative at this time. Labs, VS, hx and meds reviewed. Lunch has been ordered.

## 2021-11-02 NOTE — ED Notes (Signed)
Pt resting and watching TV, observed even RR and unlabored, blanket on pt for warmth and comfort, lights off to room to help induce sleep, NAD noted, room secured, plan of care on going, no further concerns as of present
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
## 2021-11-02 NOTE — ED Notes (Signed)
Psych at BS

## 2021-11-02 NOTE — ED Notes (Signed)
Pt appears to be sleeping, observed even RR and unlabored, blanket on pt for warmth and comfort, lights off to room to help induce sleep, NAD noted, room secured, plan of care on going, no further concerns as of present 

## 2021-11-02 NOTE — BH Assessment (Addendum)
Per [facility] provider Vesta Mixer, NP), patient meets criteria for inpatient psychiatric treatment. Per Dietrich Pates, RN no more beds available at [facility] for the night.   Referrals have also been faxed out to other hospitals for consideration of bed placement. See hospitals listed below.      Destination Service Provider Request Status Selected Services Address Phone Fax Patient Preferred  [place] [address] [place] [address] [phone] --  [place] [address] [place] [address] [phone] --  [place] N/A [place], [place] [address] [phone] --  [place] N/A [address] [place] [address] [phone] --  [facility]  Pending - Request Sent N/A [address] [place] [address] [phone] --  [facility]  Pending - Request Sent N/A [place], [place] [address] [phone] --  [address] [place] [address] [phone] --  [place] [phone] [address] [place] [address] [place] --  [facility]  Pending - Request Sent N/A [address] [place], [place] [address] [phone] --  [facility]  Pending - Request Sent N/A [address], [place] [address] [phone] --  [facility]  Pending - Request Sent N/A [place] [address], [place] [address] [phone] --  [facility]  Pending - Request Sent N/A [address] [place] [address] [phone] --  [facility]  Pending - Request Sent N/A [address] [place] [address] [phone] --  [facility]  Pending - Request Sent N/A [place]., [place] [address] [phone] --  [facility]  Pending - Request Sent N/A [address], [place] [address] [phone] --  [place] N/A [address] [place] [address] [phone] --  [facility]  Pending - Request Sent N/A [address] [place] [address] [phone] --  [facility]  Pending - Request Sent N/A [address] [place] [address] [phone] --  [facility]  Pending - Request Sent N/A [address] [place] [address] [phone] --  [place]  Pending - Request Sent N/A [place], [place] [address] [phone] --  [facility]  Pending - Request Sent N/A [address] [place] [address] [phone] --  [facility] N/A [place]., [place] [address] [phone] --  [facility]  Pending - Request Sent N/A [address] [place] [address] [phone] --

## 2021-11-02 NOTE — ED Notes (Signed)
IVC paperwork received

## 2021-11-03 MED ORDER — NICOTINE 21 MG/24HR TD PT24
21.0000 mg | MEDICATED_PATCH | Freq: Once | TRANSDERMAL | Status: DC
  Administered 2021-11-03: 21 mg via TRANSDERMAL
  Filled 2021-11-03: qty 1

## 2021-11-03 NOTE — ED Notes (Signed)
Pt continues to sleep, observed even RR and unlabored, blanket on pt for warmth and comfort, lights off to room to help induce sleep, NAD noted, sitter is monitoring pt at this time for safety, room secured, plan of care on going, no further concerns as of present

## 2021-11-03 NOTE — ED Notes (Signed)
This RN introduced self to pt. Pt calm and cooperative, ambulates to bathroom with steady gait. No reported needs at this time

## 2021-11-03 NOTE — BH Assessment (Addendum)
Patient continues to meet criteria for inpatient psychiatric treatment, per [place] provider Vesta Mixer, NP).Patient tentatively accepted to [facility] by Dr. Weber Cooks. Details of acceptance is pending at this time.   Meanwhile,'@1208'$ , received a phone call from [facility] with [facility]. Patient accepted to [facility] for admission 11/04/2021 (after 8am). Patient may present to [facility]s main [facility] upon arrival to the facility.  he accepting provider is Dr. Jonelle Sports. Nurse report [phone]. Patient is under IVC and nursing will need to arrange transportation.   Clinician sought out clarification from Dr. Weber Cooks @ 1215, if the [facility] bed will be available for patient at [facility] today. Awaiting updates from Dr. Weber Cooks.   In the event, the bed is unavailable patient does have an acceptance admission to [facility] for tomorrow.

## 2021-11-03 NOTE — ED Notes (Signed)
Lunch order placed

## 2021-11-03 NOTE — ED Notes (Signed)
Pt verbalized he is NOT SI at this time when asked Suicide Risk Shift Assessment questions

## 2021-11-03 NOTE — ED Notes (Signed)
Pt resting and watching TV, observed even RR and unlabored, blanket on pt for warmth and comfort, lights off to room to help induce sleep, NAD noted, sitter within view of pt for safety, room secured, plan of care on going, no further concerns as of present

## 2021-11-03 NOTE — ED Notes (Signed)
Pt given items to brush his teeth as requested, advised pt may take a shower in the morning when day shift arrives, pt verbalized understanding

## 2021-11-03 NOTE — ED Notes (Signed)
Pt up to BR w/o assistance, steady gait noted

## 2021-11-03 NOTE — ED Notes (Signed)
Breakfast order placed ?

## 2021-11-03 NOTE — BH Assessment (Addendum)
Per [facility] provider Vesta Mixer, NP), patient continues to meet criteria for inpatient psychiatric treatment. Patient under review for bed admission to [facility] by the [place] Carlis Abbott, RN).  Referrals have also been faxed out to other hospitals for consideration of bed placement. See hospitals listed below.   Destination Service Provider Request Status Selected Services Address Phone Fax Patient Preferred  [facility] [address], [place] [address] [phone] --  [facility]   N/A [address] [place] [address] [phone] --  [facility]   N/A [address] [place], [place] [address] [phone] --  [place] [address] [place] [address] [phone] --  [facility]   N/A [address], [place] [address] [phone] --  [facility]   N/A [place], [place] [address] [phone] --  [facility]   N/A [address] [place] [address] [phone] --  [facility]   N/A [phone] [address] [place] [address] [place] --  [facility]   N/A [address] [place], [place] [address] [phone] --  [facility]   N/A [address] [place] [address] [phone] --  [facility]   N/A [address] [place] [address] [phone] --  [place]   N/A [address] [place] [address] [phone] --  [facility]   N/A [address] [place] [address] [phone] --  [facility]   N/A [address], [place] [address] [phone] --  [facility]   N/A [address] [place] [address] [phone] --  [facility]   N/A [address], [place] [address] [phone] --  [facility]   N/A [address] [place] [address] [phone] --  [facility]   N/A [place], [place] [address] [phone] --  [facility]   N/A [address], [place] [address] [phone] --  [facility]   N/A [place], [place] [address] [phone] --  [facility]   N/A [address], [place] [address] [phone] --  [facility]   N/A [address] [place] [address] [phone] --  [facility]   N/A [address] [place] [address] [phone] --

## 2021-11-04 NOTE — ED Provider Notes (Signed)
Emergency Medicine Observation Re-evaluation Note  Andrew Kaiser is a 61 y.o. male, seen on rounds today.  Pt initially presented to the ED for complaints of psych evaluation.  Bed offer from [facility], pt updated. No new c/o this AM.   Physical Exam  BP 121/81   Pulse 78   Temp 97.9 F (36.6 C) (Oral)   Resp 20   Ht 1.829 m (6')   Wt 96.6 kg   SpO2 98%   BMI 28.89 kg/m  Physical Exam General: calm, conversant, pleasant.  Cardiac: regular rate. Lungs: breathing comfortably. Psych: normal mood and affect. No SI/HI. Not currently responding to internal stimuli.   ED Course / MDM    I have reviewed the labs performed to date as well as medications administered while in observation.  Recent changes in the last 24 hours include ED obs, reassessment.   Plan  [facility] team indicates patient accepted to [facility], Dr Selinda Flavin.  Pt appears stable for transfer.          Lajean Saver, MD 11/04/21 [phone]

## 2021-11-04 NOTE — ED Notes (Signed)
Review of IVC Docs in [place] chart reflects expiration of 11/09/21.

## 2021-11-04 NOTE — ED Notes (Signed)
Introduce myself to pt. Pt lying in bed, calm and cooperative. Pt requesting to shave. Advise pt he would not be able to shave at this time.

## 2021-11-04 NOTE — ED Notes (Signed)
Breakfast order placed ?

## 2021-11-24 ENCOUNTER — Encounter (HOSPITAL_COMMUNITY): Payer: Self-pay

## 2021-11-24 ENCOUNTER — Ambulatory Visit (INDEPENDENT_AMBULATORY_CARE_PROVIDER_SITE_OTHER): Payer: Medicaid Other | Admitting: Mental Health

## 2021-11-24 DIAGNOSIS — F3113 Bipolar disorder, current episode manic without psychotic features, severe: Secondary | ICD-10-CM | POA: Diagnosis not present
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
## 2021-11-24 NOTE — Plan of Care (Signed)
  Problem: Bipolar Disorder CCP Problem  1 Bipolar d/o Goal: LTG: Stabilize mood and increase goal-directed behavior AEB self-report Outcome: Initial Goal: STG: Andrew "[name]" will increase stability in moods AEB development of x 3 effective coping skills with ability to develop daily routine within the next 6 moths  Outcome: Initial Goal: STG: Andrew "[name]" increase stability in moods AEB daily medication compliance within the next 6 months  Outcome: Initial

## 2021-12-07 ENCOUNTER — Ambulatory Visit (HOSPITAL_COMMUNITY): Payer: No Typology Code available for payment source | Admitting: Mental Health

## 2022-02-12 ENCOUNTER — Emergency Department (HOSPITAL_COMMUNITY): Payer: Medicaid Other

## 2022-02-12 ENCOUNTER — Observation Stay (HOSPITAL_COMMUNITY)
Admission: EM | Admit: 2022-02-12 | Discharge: 2022-02-14 | DRG: 299 | Disposition: A | Discharge status: Home / Self Care | Payer: Medicaid Other | Attending: Family Medicine | Admitting: Family Medicine

## 2022-02-12 DIAGNOSIS — I2699 Other pulmonary embolism without acute cor pulmonale: Secondary | ICD-10-CM | POA: Diagnosis present

## 2022-02-12 DIAGNOSIS — I82422 Acute embolism and thrombosis of left iliac vein: Secondary | ICD-10-CM | POA: Diagnosis present

## 2022-02-12 DIAGNOSIS — N281 Cyst of kidney, acquired: Secondary | ICD-10-CM | POA: Diagnosis not present

## 2022-02-12 DIAGNOSIS — I82462 Acute embolism and thrombosis of left calf muscular vein: Secondary | ICD-10-CM | POA: Diagnosis not present

## 2022-02-12 DIAGNOSIS — R300 Dysuria: Secondary | ICD-10-CM | POA: Diagnosis present

## 2022-02-12 DIAGNOSIS — I82412 Acute embolism and thrombosis of left femoral vein: Principal | ICD-10-CM | POA: Diagnosis present

## 2022-02-12 DIAGNOSIS — M79605 Pain in left leg: Secondary | ICD-10-CM | POA: Diagnosis present

## 2022-02-12 DIAGNOSIS — I679 Cerebrovascular disease, unspecified: Secondary | ICD-10-CM | POA: Diagnosis present

## 2022-02-12 DIAGNOSIS — Z79899 Other long term (current) drug therapy: Secondary | ICD-10-CM | POA: Diagnosis not present

## 2022-02-12 DIAGNOSIS — Z823 Family history of stroke: Secondary | ICD-10-CM | POA: Diagnosis not present

## 2022-02-12 DIAGNOSIS — R7401 Elevation of levels of liver transaminase levels: Secondary | ICD-10-CM | POA: Diagnosis not present

## 2022-02-12 DIAGNOSIS — I824Y2 Acute embolism and thrombosis of unspecified deep veins of left proximal lower extremity: Principal | ICD-10-CM

## 2022-02-12 DIAGNOSIS — F319 Bipolar disorder, unspecified: Secondary | ICD-10-CM | POA: Diagnosis not present

## 2022-02-12 DIAGNOSIS — Z8774 Personal history of (corrected) congenital malformations of heart and circulatory system: Secondary | ICD-10-CM

## 2022-02-12 DIAGNOSIS — Z8673 Personal history of transient ischemic attack (TIA), and cerebral infarction without residual deficits: Secondary | ICD-10-CM | POA: Diagnosis not present

## 2022-02-12 DIAGNOSIS — I82442 Acute embolism and thrombosis of left tibial vein: Secondary | ICD-10-CM | POA: Diagnosis present

## 2022-02-12 DIAGNOSIS — I82452 Acute embolism and thrombosis of left peroneal vein: Secondary | ICD-10-CM | POA: Diagnosis not present

## 2022-02-12 DIAGNOSIS — I82402 Acute embolism and thrombosis of unspecified deep veins of left lower extremity: Secondary | ICD-10-CM

## 2022-02-12 DIAGNOSIS — F419 Anxiety disorder, unspecified: Secondary | ICD-10-CM | POA: Diagnosis not present

## 2022-02-12 DIAGNOSIS — I82432 Acute embolism and thrombosis of left popliteal vein: Secondary | ICD-10-CM | POA: Diagnosis present

## 2022-02-12 DIAGNOSIS — I82409 Acute embolism and thrombosis of unspecified deep veins of unspecified lower extremity: Secondary | ICD-10-CM

## 2022-02-12 LAB — URINALYSIS, ROUTINE W REFLEX MICROSCOPIC
Bacteria, UA: NONE SEEN
Bilirubin Urine: NEGATIVE
Glucose, UA: NEGATIVE mg/dL
Ketones, ur: NEGATIVE mg/dL
Leukocytes,Ua: NEGATIVE
Nitrite: NEGATIVE
Protein, ur: 100 mg/dL — AB
Specific Gravity, Urine: 1.032 — ABNORMAL HIGH (ref 1.005–1.030)
pH: 5 (ref 5.0–8.0)

## 2022-02-12 NOTE — ED Triage Notes (Signed)
BIB EMS called out for "not acting like himself" since Thanksgiving.  Reports he has been having a hard time "since his mom died in April 16, 2022"  Reports right and left knees are "buckling" on him.  Pain to left thigh as well.  Also reports dark urine.

## 2022-02-12 NOTE — ED Provider Triage Note (Signed)
Emergency Medicine Provider Triage Evaluation Note  Andrew Kaiser , a 61 y.o. male  was evaluated in triage.  Pt complains of bilateral knee pain, feeling like his knees are buckling.  Also noted his left leg is more swollen than his right 1.  He is concerned for a blood clot although he does not have any prior history of blood clots.  He does not know any history of clotting this morning, no trauma, no fever.No shortness of breath  Review of Systems  Positive: Leg swelling Negative: Fever,rash, sob  Physical Exam  There were no vitals taken for this visit. Gen:   Awake, no distress   Resp:  Normal effort  MSK:   Moves extremities without difficulty  Other:  Left leg is swollen with some mild calf tenderness  Medical Decision Making  Medically screening exam initiated at 9:21 PM.  Appropriate orders placed.  Andrew Kaiser was informed that the remainder of the evaluation will be completed by another provider, this initial triage assessment does not replace that evaluation, and the importance of remaining in the ED until their evaluation is complete.     Janeece Fitting, PA-C 02/12/22 2125

## 2022-02-13 ENCOUNTER — Observation Stay (HOSPITAL_COMMUNITY): Payer: Medicaid Other

## 2022-02-13 ENCOUNTER — Other Ambulatory Visit: Payer: Self-pay

## 2022-02-13 ENCOUNTER — Emergency Department (HOSPITAL_COMMUNITY): Payer: Medicaid Other

## 2022-02-13 ENCOUNTER — Emergency Department (HOSPITAL_BASED_OUTPATIENT_CLINIC_OR_DEPARTMENT_OTHER): Payer: Medicaid Other

## 2022-02-13 DIAGNOSIS — I82402 Acute embolism and thrombosis of unspecified deep veins of left lower extremity: Secondary | ICD-10-CM | POA: Diagnosis not present

## 2022-02-13 DIAGNOSIS — I824Y2 Acute embolism and thrombosis of unspecified deep veins of left proximal lower extremity: Secondary | ICD-10-CM | POA: Insufficient documentation

## 2022-02-13 DIAGNOSIS — M7989 Other specified soft tissue disorders: Secondary | ICD-10-CM | POA: Diagnosis not present

## 2022-02-13 DIAGNOSIS — R52 Pain, unspecified: Secondary | ICD-10-CM

## 2022-02-13 DIAGNOSIS — I2699 Other pulmonary embolism without acute cor pulmonale: Secondary | ICD-10-CM | POA: Diagnosis present

## 2022-02-13 DIAGNOSIS — I82409 Acute embolism and thrombosis of unspecified deep veins of unspecified lower extremity: Secondary | ICD-10-CM

## 2022-02-13 LAB — COMPREHENSIVE METABOLIC PANEL
ALT: 46 U/L — ABNORMAL HIGH (ref 0–44)
AST: 39 U/L (ref 15–41)
Albumin: 3.5 g/dL (ref 3.5–5.0)
Alkaline Phosphatase: 62 U/L (ref 38–126)
Anion gap: 13 (ref 5–15)
BUN: 15 mg/dL (ref 6–20)
CO2: 24 mmol/L (ref 22–32)
Calcium: 9.5 mg/dL (ref 8.9–10.3)
Chloride: 97 mmol/L — ABNORMAL LOW (ref 98–111)
Creatinine, Ser: 1.18 mg/dL (ref 0.61–1.24)
GFR, Estimated: >60 mL/min (ref >60)
Glucose, Bld: 128 mg/dL — ABNORMAL HIGH (ref 70–99)
Potassium: 3.5 mmol/L (ref 3.5–5.1)
Sodium: 134 mmol/L — ABNORMAL LOW (ref 135–145)
Total Bilirubin: 2.2 mg/dL — ABNORMAL HIGH (ref 0.3–1.2)
Total Protein: 8.1 g/dL (ref 6.5–8.1)

## 2022-02-13 LAB — CBC WITH DIFFERENTIAL/PLATELET
Abs Immature Granulocytes: 0.08 10*3/uL — ABNORMAL HIGH (ref 0.00–0.07)
Basophils Absolute: 0 10*3/uL (ref 0.0–0.1)
Basophils Relative: 0 %
Eosinophils Absolute: 0 10*3/uL (ref 0.0–0.5)
Eosinophils Relative: 0 %
HCT: 37.6 % — ABNORMAL LOW (ref 39.0–52.0)
Hemoglobin: 13 g/dL (ref 13.0–17.0)
Immature Granulocytes: 1 %
Lymphocytes Relative: 7 %
Lymphs Abs: 0.9 10*3/uL (ref 0.7–4.0)
MCH: 29.9 pg (ref 26.0–34.0)
MCHC: 34.6 g/dL (ref 30.0–36.0)
MCV: 86.4 fL (ref 80.0–100.0)
Monocytes Absolute: 1 10*3/uL (ref 0.1–1.0)
Monocytes Relative: 8 %
Neutro Abs: 10.5 10*3/uL — ABNORMAL HIGH (ref 1.7–7.7)
Neutrophils Relative %: 84 %
Platelets: 183 10*3/uL (ref 150–400)
RBC: 4.35 MIL/uL (ref 4.22–5.81)
RDW: 12.3 % (ref 11.5–15.5)
WBC: 12.6 10*3/uL — ABNORMAL HIGH (ref 4.0–10.5)
nRBC: 0 % (ref 0.0–0.2)

## 2022-02-13 LAB — D-DIMER, QUANTITATIVE: D-Dimer, Quant: >20 ug/mL-FEU — ABNORMAL HIGH (ref 0.00–0.50)

## 2022-02-13 LAB — HEPARIN LEVEL (UNFRACTIONATED): Heparin Unfractionated: 0.36 IU/mL (ref 0.30–0.70)

## 2022-02-13 MED ORDER — RISPERIDONE 1 MG PO TABS
2.0000 mg | ORAL_TABLET | Freq: Every day | ORAL | Status: DC
  Administered 2022-02-13: 2 mg via ORAL
  Filled 2022-02-13 (×2): qty 2

## 2022-02-13 MED ORDER — HYDROXYZINE HCL 25 MG PO TABS: 25.0000 mg | ORAL_TABLET | Freq: Three times a day (TID) | ORAL | Status: DC | PRN

## 2022-02-13 MED ORDER — OXCARBAZEPINE 300 MG PO TABS
300.0000 mg | ORAL_TABLET | Freq: Two times a day (BID) | ORAL | Status: DC
  Administered 2022-02-13 – 2022-02-14 (×3): 300 mg via ORAL
  Filled 2022-02-13 (×4): qty 1

## 2022-02-13 MED ORDER — IOHEXOL 350 MG/ML SOLN
75.0000 mL | Freq: Once | INTRAVENOUS | Status: AC | PRN
  Administered 2022-02-13: 75 mL via INTRAVENOUS

## 2022-02-13 MED ORDER — HEPARIN BOLUS VIA INFUSION
6000.0000 [IU] | Freq: Once | INTRAVENOUS | Status: AC
  Administered 2022-02-13: 6000 [IU] via INTRAVENOUS
  Filled 2022-02-13: qty 6000

## 2022-02-13 MED ORDER — IOHEXOL 350 MG/ML SOLN
100.0000 mL | Freq: Once | INTRAVENOUS | Status: AC | PRN
  Administered 2022-02-13: 100 mL via INTRAVENOUS

## 2022-02-13 MED ORDER — ACETAMINOPHEN 325 MG PO TABS
650.0000 mg | ORAL_TABLET | Freq: Four times a day (QID) | ORAL | Status: DC | PRN
  Administered 2022-02-13: 650 mg via ORAL
  Filled 2022-02-13: qty 2

## 2022-02-13 MED ORDER — HEPARIN (PORCINE) 25000 UT/250ML-% IV SOLN
1850.0000 [IU]/h | INTRAVENOUS | Status: DC
  Administered 2022-02-13: 1600 [IU]/h via INTRAVENOUS
  Administered 2022-02-14: 1850 [IU]/h via INTRAVENOUS
  Filled 2022-02-13 (×3): qty 250

## 2022-02-13 NOTE — Assessment & Plan Note (Signed)
H/o CVA x3 in 2006. -Crestor 10 mg

## 2022-02-13 NOTE — ED Provider Notes (Signed)
[facility] EMERGENCY DEPARTMENT Provider Note   CSN: [id number] Arrival date & time: 02/12/22  2113     History  Chief Complaint  Patient presents with   Knee Pain    Andrew Kaiser is a 61 y.o. male.  The history is provided by the patient and medical records.  Knee Pain Andrew Kaiser is a 61 y.o. male who presents to the Emergency Department complaining of leg pain.  He presents to the ED for evaluation of left leg pain that started two weeks ago.  Pain is in the anterior thigh, described as soreness.  Pain is constant.  Pain is worse with movement.  Has local swelling.  Has pain on walking.  Also has knee pain.  No known injuries.  Pt states he feels "out of it".  No fatigue.  No fever, chest pain.  Has mild sob for two weeks.  Has occasional n/v.  Has mild dysuria.   Hx/o CVA.  No hx/o DVT/PE.    Lives alone.  No SI.        Home Medications Prior to Admission medications   Medication Sig Start Date End Date Taking? Authorizing Provider  hydrOXYzine (VISTARIL) 25 MG capsule Take 25 mg by mouth 3 (three) times daily as needed for itching.    [provider]  mirtazapine (REMERON) 15 MG tablet TAKE 1 TABLET BY MOUTH AT BEDTIME 04/03/20 11/02/21    Multiple Vitamin (MULTIVITAMIN WITH MINERALS) TABS tablet Take 1 tablet by mouth daily.    [provider]  Oxcarbazepine (TRILEPTAL) 300 MG tablet Take 300 mg by mouth 2 (two) times daily.    [provider]  risperiDONE (RISPERDAL) 0.5 MG tablet TAKE 1 TABLET BY MOUTH DAILY AT BEDTIME. 04/03/20 11/02/21        Allergies    Patient has no known allergies.    Review of Systems   Review of Systems  All other systems reviewed and are negative.   Physical Exam Updated Vital Signs BP 116/70   Pulse 78   Temp 98.5 F (36.9 C) (Oral)   Resp 20   Ht 6' (1.829 m)   Wt 88.5 kg   SpO2 96%   BMI 26.45 kg/m  Physical Exam Vitals and nursing note reviewed.  Constitutional:       Appearance: He is well-developed.  HENT:     Head: Normocephalic and atraumatic.  Cardiovascular:     Rate and Rhythm: Normal rate and regular rhythm.     Heart sounds: No murmur heard. Pulmonary:     Effort: Pulmonary effort is normal. No respiratory distress.     Breath sounds: Normal breath sounds.  Abdominal:     Palpations: Abdomen is soft.     Tenderness: There is no abdominal tenderness. There is no guarding or rebound.  Musculoskeletal:     Comments: 2+ DP pulses bilaterally.  1+ pitting edema to LLE.  No significant edema to RLE.  There is tenderness to palpation to the left thigh without overlying skin changes.  Mild tenderness to left knee without local erythema or edema.  No palpable effusion in the knee.  He is able to flex/extend the knee.  Has pain with ROM of the left hip and pain predominantly in the thigh on exam.    Skin:    General: Skin is warm and dry.  Neurological:     Mental Status: He is alert and oriented to person, place, and time.  Psychiatric:  Behavior: Behavior normal.     ED Results / Procedures / Treatments   Labs (all labs ordered are listed, but only abnormal results are displayed) Labs Reviewed  URINALYSIS, ROUTINE W REFLEX MICROSCOPIC - Abnormal; Notable for the following components:      Result Value   Color, Urine AMBER (*)    APPearance HAZY (*)    Specific Gravity, Urine 1.032 (*)    Hgb urine dipstick SMALL (*)    Protein, ur 100 (*)    All other components within normal limits  CBC WITH DIFFERENTIAL/PLATELET - Abnormal; Notable for the following components:   WBC 12.6 (*)    HCT 37.6 (*)    Neutro Abs 10.5 (*)    Abs Immature Granulocytes 0.08 (*)    All other components within normal limits  COMPREHENSIVE METABOLIC PANEL - Abnormal; Notable for the following components:   Sodium 134 (*)    Chloride 97 (*)    Glucose, Bld 128 (*)    ALT 46 (*)    Total Bilirubin 2.2 (*)    All other components within normal limits   D-DIMER, QUANTITATIVE - Abnormal; Notable for the following components:   D-Dimer, Quant >20.00 (*)    All other components within normal limits    EKG None  Radiology DG Hip Unilat W or Wo Pelvis 2-3 Views Left  Result Date: 02/13/2022 CLINICAL DATA:  Hip and knee pain. EXAM: DG HIP (WITH OR WITHOUT PELVIS) 2-3V LEFT COMPARISON:  None Available. FINDINGS: There is no evidence of hip fracture or dislocation. There is no evidence of arthropathy or other focal bone abnormality. IMPRESSION: Negative. Electronically Signed   By: Virgina Norfolk M.D.   On: 02/13/2022 02:18   DG Chest 2 View  Result Date: 02/13/2022 CLINICAL DATA:  Shortness of breath and knee pain. EXAM: CHEST - 2 VIEW COMPARISON:  November 27, 2012 FINDINGS: The heart size and mediastinal contours are within normal limits. A radiopaque atrial septal occlusion device is noted. Both lungs are clear. The visualized skeletal structures are unremarkable. IMPRESSION: No active cardiopulmonary disease. Electronically Signed   By: Virgina Norfolk M.D.   On: 02/13/2022 02:17   DG Knee 3 Views Right  Result Date: 02/12/2022 CLINICAL DATA:  Pain. EXAM: RIGHT KNEE - 3 VIEW COMPARISON:  Right knee x-ray 06/13/2019 FINDINGS: No evidence of fracture, dislocation, or joint effusion. Joint spaces are maintained. Mild degenerative osteophyte formation is noted in the medial compartment, stable. Soft tissues are unremarkable. IMPRESSION: Negative. Electronically Signed   By: Ronney Asters M.D.   On: 02/12/2022 22:42   DG Knee 3 Views Left  Result Date: 02/12/2022 CLINICAL DATA:  Pain EXAM: LEFT KNEE - 3 VIEW COMPARISON:  None Available. FINDINGS: No evidence of fracture, dislocation, or joint effusion. No evidence of arthropathy or other focal bone abnormality. Soft tissues are unremarkable. IMPRESSION: Negative. Electronically Signed   By: Ronney Asters M.D.   On: 02/12/2022 22:33    Procedures Procedures    Medications Ordered in  ED Medications - No data to display  ED Course/ Medical Decision Making/ A&P                           Medical Decision Making Amount and/or Complexity of Data Reviewed Labs: ordered. Radiology: ordered.   Patient here for evaluation of left lower extremity pain.  He does have soft tissue swelling with good pulses in the leg.  He also complains of shortness  of breath.  D-dimer was obtained, which is elevated.  Plan to obtain CTA to rule out PE.  Patient will also need a vascular ultrasound to rule out DVT.  No clinical evidence of soft tissue infection to the left lower extremity.  Patient care transferred pending additional imaging.       Final Clinical Impression(s) / ED Diagnoses Final diagnoses:  None    Rx / DC Orders ED Discharge Orders     None         Quintella Reichert, MD 02/13/22 [phone]

## 2022-02-13 NOTE — Progress Notes (Signed)
VASCULAR LAB    Left lower extremity venous duplex has been performed.  See CV proc for preliminary results.  Messaged results to Dr. Alvino Chapel via secure chat.  Ha Placeres, RVT 02/13/2022, 9:07 AM

## 2022-02-13 NOTE — ED Provider Notes (Signed)
  Physical Exam  BP 124/78   Pulse 86   Temp 98.5 F (36.9 C) (Oral)   Resp 15   Ht 6' (1.829 m)   Wt 88.5 kg   SpO2 98%   BMI 26.45 kg/m   Physical Exam  Procedures  Procedures  ED Course / MDM    Medical Decision Making Amount and/or Complexity of Data Reviewed Labs: ordered. Radiology: ordered.  Risk Prescription drug management.   Received patient in signout.  Left leg pain and swelling for the last 2 weeks also with shortness of breath.  Not hypoxic but did have initial tachycardia.  CT scan done and independently interpreted and also discussed with radiologist and shows large pulmonary embolisms.  No right heart strain and does not saddle but is bilateral.  Also has large left lower extremity DVT.  Unable to see proximal aspect of it.  Will start on heparin drip.  Will discuss with vascular surgery and unassigned medicine.       Davonna Belling, MD 02/14/22 [phone]

## 2022-02-13 NOTE — Progress Notes (Signed)
Pt seen and examined this afternoon.  Extensive LLE DVT.  Formal note to follow in the AM. Plan for percutaneous thrombectomy, with possible stenting for may thurner in the early part of next week.   [name] MD

## 2022-02-13 NOTE — Progress Notes (Signed)
[place] for heparin Indication: pulmonary embolus  Heparin Dosing Weight: 88.5 kg  Labs: Recent Labs    02/13/22 0149 02/13/22 1630  HGB 13.0  --   HCT 37.6*  --   PLT 183  --   HEPARINUNFRC  --  0.36  CREATININE 1.18  --      Assessment: 45 yom presenting with acute panlobar PE, no pulmonary infarct or R heart dilatation. Pharmacy consulted to dose heparin. Imaging negative for bleed. Patient is not on anticoagulation PTA. CBC wnl.  Initial heparin level at goal, however at low end (0.36). No bleeding or IV issues noted.   Goal of Therapy:  Heparin level 0.3-0.7 units/ml Monitor platelets by anticoagulation protocol: Yes   Plan:  Increase heparin to 1700 units/hr to keep within midrange of goal given PE Monitor daily CBC and heparin level, s/sx bleeding F/u long-term plan for anticoagulation  Erin Hearing PharmD., BCPS Clinical Pharmacist 02/13/2022 5:42 PM

## 2022-02-13 NOTE — ED Notes (Signed)
Change of shift report received.

## 2022-02-13 NOTE — Assessment & Plan Note (Addendum)
Left leg pain and swelling x 2 weeks with imaging confirm DVT, proximal end not visualized on [place]. SOB and D-dimer >20 with CTA confirmed acute panlobar pulmonary emboli, no pulmonary infarct or right heart dilatation. VSS, no hypoxemia. Appears to be unprovoked. - Continue Heparin gtt per pharmacy, consider switch to [place] - Vascular surgery consulted, pending recommendations - Cardiac monitoring - Amb Pulse Ox - Consider echo to further evaluate for right heart strain if symptomatic with ambulation

## 2022-02-13 NOTE — ED Notes (Signed)
Dietary called for lunch order

## 2022-02-13 NOTE — ED Notes (Signed)
Pt provided [place] sandwich and water. Sitting in bed, eating.

## 2022-02-13 NOTE — ED Notes (Signed)
Hospitalist at bedside 

## 2022-02-13 NOTE — Assessment & Plan Note (Deleted)
Patient has been experiencing 2 weeks of left leg pain and shortness of breath which has been exacerbated by movements. Soft tissue swelling and warmth is present on exam. D-dimer >20, CTA confirmed acute panlobar pulmonary emboli, no pulmonary infarct or right heart dilatation. Bolus 6000 units of heparin was given and then drip was started. VSS, no hypoxemia. - Admit to FMTS, med tele, attending Dr. Erin Hearing   - Continue Heparin at 16 mL/hr  - Vascular surgery consulted, pending recommendations

## 2022-02-13 NOTE — Progress Notes (Signed)
FMTS Brief Progress Note  S:Went bedside to see patient. Patient laying comfortably, complains of some leg pain, but hadn't gotten his tylenol yet.     O: BP 104/67 (BP Location: Right Arm)   Pulse 98   Temp 99.3 F (37.4 C) (Oral)   Resp 18   Ht 6' (1.829 m)   Wt 88.5 kg   SpO2 100%   BMI 26.45 kg/m   Physical Exam Constitutional:      General: He is not in acute distress.    Appearance: Normal appearance. He is not ill-appearing.  Cardiovascular:     Rate and Rhythm: Normal rate and regular rhythm.     Pulses: Normal pulses.     Heart sounds: Normal heart sounds. No murmur heard.    No friction rub. No gallop.  Pulmonary:     Effort: Pulmonary effort is normal. No respiratory distress.     Breath sounds: Normal breath sounds. No stridor. No wheezing, rhonchi or rales.  Abdominal:     General: Abdomen is flat. There is no distension.     Palpations: Abdomen is soft.     Tenderness: There is no abdominal tenderness.  Musculoskeletal:     Left lower leg: Swelling and tenderness present. No deformity or lacerations.  Neurological:     Mental Status: He is alert.      A/P: Acute Pulmonary Embolism and DVT of LLE -Continue Heparin gtt per pharmacy -Vasc Surg consulted/pending recs -Cardiac Monitoring -Consider Echo in am -Tylenol q6h prn for leg pain - Orders reviewed. Labs for AM ordered, which was adjusted as needed.   Holley Bouche, MD 02/13/2022, 9:27 PM PGY-2, [place] Night Resident  Please page [phone] with questions.

## 2022-02-13 NOTE — ED Notes (Signed)
Patient resting in bed. Urinal emptied, patient medicated. Pt denies any other needs at this time. No signs of distress noted.

## 2022-02-13 NOTE — Hospital Course (Addendum)
Andrew Kaiser is a 61 y.o. male with past medical history of bipolar disorder, anxiety, and depression presented to ED with left leg pain, swelling, and shortness of breath for two weeks which has been constant and worse with movements admitted for LLE DVT with bilateral PE. Hospital course is outlined below:  LLE DVT with Bilateral PE Patient presented to the ED with two weeks of left leg swelling, pain, and shortness of breath over the past two weeks. In the ED, patient was worked up for a DVT and PE. His D-dimer was positive and CTA demonstrated a bilateral PE. Venous doppler demonstrated an extensive L leg DVT.

## 2022-02-13 NOTE — Progress Notes (Signed)
[place] for heparin Indication: pulmonary embolus  Heparin Dosing Weight: 88.5 kg  Labs: Recent Labs    02/13/22 0149  HGB 13.0  HCT 37.6*  PLT 183  CREATININE 1.18    Assessment: 18 yom presenting with acute panlobar PE, no pulmonary infarct or R heart dilatation. Pharmacy consulted to dose heparin. Imaging negative for bleed. Patient is not on anticoagulation PTA. CBC wnl.  Goal of Therapy:  Heparin level 0.3-0.7 units/ml Monitor platelets by anticoagulation protocol: Yes   Plan:  Heparin 6000 unit bolus Start heparin at 1600 units/h 6h heparin level Monitor daily CBC, s/sx bleeding F/u long-term plan for anticoagulation   Arturo Morton, PharmD, BCPS Clinical Pharmacist 02/13/2022 9:44 AM

## 2022-02-13 NOTE — Progress Notes (Signed)
     Daily Progress Note Intern Pager: [phone]  Patient name: Andrew Kaiser Medical record number: [id number] Date of birth: June 09, 1960 Age: 61 y.o. Gender: male  Primary Care Provider: [facility] Consultants: Vascular Code Status: Full  Pt Overview and Major Events to Date:  12/2 - Admitted  Assessment and Plan:  Andrew Kaiser is a 61 y.o. male presenting with left leg pain and swelling, and shortness of breath for two weeks, found to have LLE DVT with bilateral PE.    * Acute embolism and thrombosis of unspecified deep veins of left lower extremity (HCC) Left leg pain and swelling x 2 weeks with imaging confirm DVT, proximal end not visualized on [place]. SOB and D-dimer >20 with CTA confirmed acute panlobar pulmonary emboli, no pulmonary infarct or right heart dilatation. VSS, no hypoxemia. Appears to be unprovoked. - Continue Heparin gtt per pharmacy, consider switch to [place] - Vascular surgery consulted, pending recommendations - Cardiac monitoring - Amb Pulse Ox - Consider echo to further evaluate for right heart strain if symptomatic with ambulation  Cerebral vascular disease H/o CVA x3 in 2006. -Crestor 10 mg  Transaminitis Slightly elevated AST and ALT. Etiology unknown at this time, but has been elevated in past.  -Continue to monitor -AM CMP   Other Chronic Conditions Bipolar disorder - Risperidone '2mg'$  qhs, Oxcarbamazepine '300mg'$  BID Anxiety - Atarax '25mg'$  TID PRN  FEN/GI: Reg Diet PPx: Heparin Dispo:Home pending clinical improvement .    Subjective:  Doing well, appreciates breathing and pain are improved  Objective: Temp:  [97.6 F (36.4 C)-99.4 F (37.4 C)] 97.6 F (36.4 C) (12/03 0453) Pulse Rate:  [78-98] 89 (12/03 0700) Resp:  [7-22] 22 (12/03 0700) BP: (104-135)/(64-81) 117/72 (12/03 0700) SpO2:  [96 %-100 %] 96 % (12/03 0700) Physical Exam: General: NAD, comfortable, African American male Cardiovascular: RRR,  NRMG, S1/S2 Respiratory: CTABL, no wheezes or stridor Abdomen: Soft, NTTP, non-distended Extremities: Moving all independently, no swelling in LLE  Laboratory: Most recent CBC Lab Results  Component Value Date   WBC 9.8 02/14/2022   HGB 11.9 (L) 02/14/2022   HCT 34.9 (L) 02/14/2022   MCV 86.8 02/14/2022   PLT 207 02/14/2022   Most recent BMP    Latest Ref Rng & Units 02/14/2022    4:56 AM  BMP  Glucose 70 - 99 mg/dL 103   BUN 6 - 20 mg/dL 14   Creatinine 0.61 - 1.24 mg/dL 1.10   Sodium 135 - 145 mmol/L 134   Potassium 3.5 - 5.1 mmol/L 4.0   Chloride 98 - 111 mmol/L 96   CO2 22 - 32 mmol/L 26   Calcium 8.9 - 10.3 mg/dL 8.9    Other pertinent labs: Unfractionated Hep 0.28    Holley Bouche, MD 02/14/2022, 7:12 AM  PGY-2, [place] Intern pager: [phone], text pages welcome Secure chat group [place]

## 2022-02-13 NOTE — ED Notes (Addendum)
Patient transported to VAS 

## 2022-02-13 NOTE — ED Notes (Signed)
Pt taken to xray 

## 2022-02-13 NOTE — H&P (Cosign Needed Addendum)
Hospital Admission History and Physical Service Pager: [phone]  Patient name: Andrew Kaiser Medical record number: [id number] Date of Birth: 1961/02/09 Age: 61 y.o. Gender: male  Primary Care Provider: [facility] Consultants: Vascular Code Status: FULL Preferred Emergency Contact: Arsenio Loader (friend) [phone]  Chief Complaint: Left leg pain and shortness of breath  Assessment and Plan: Andrew Kaiser is a 61 y.o. male presenting with left leg pain and swelling, and shortness of breath for two weeks, found to have LLE DVT with bilateral PE.    * Acute embolism and thrombosis of unspecified deep veins of left lower extremity (HCC) Left leg pain and swelling x 2 weeks with imaging confirm DVT, proximal end not visualized on [place]. SOB and D-dimer >20 with CTA confirmed acute panlobar pulmonary emboli, no pulmonary infarct or right heart dilatation. VSS, no hypoxemia. Appears to be unprovoked. - Admit to FMTS, med tele, attending Dr. Erin Hearing   - Continue Heparin gtt per pharmacy - Vascular surgery consulted, pending recommendations - Consider hypercoag work up for underlying clotting disorder - CT venogram pending to visualize proximal end of DVT - Cardiac monitoring - Consider echo to further evaluate for right heart strain  Cerebral vascular disease H/o CVA x3 in 2006. -Consider starting statin   Other Chronic Conditions Bipolar disorder - Risperidone '2mg'$  qhs, Oxcarbamazepine '300mg'$  BID Anxiety - Atarax '25mg'$  TID PRN  FEN/GI: Regular diet VTE Prophylaxis: Heparin  Disposition: med-tele  History of Present Illness:  Andrew Kaiser is a 61 y.o. male with PMHX stroke x3 and bipolar presenting with constant left leg pain and swelling x 2 weeks. Endorses similar symptoms in the R leg in the past but none currently. Patient states he now has shortness of breath x 3 days. Less active than usual, but still performing ADLs and "walks a lot".  Denies recent immobilization, prolonged bed rest, travel or surgery. Denies family history of clotting disorders. Denies chest pain, abdominal pain, N/V.  Takes psychiatric medications for bipolar disorder intermittently, most recently last week.  In the ED, D-dimer elevated and imaging confirmed extensive DVT and PE. Patient was started on heparin drip and vascular surgery was consulted.  Review Of Systems: Per HPI with the following additions: nausea and dysuria, both of which are somewhat chronic  Pertinent Past Medical History: 2006 - CVA x3 likely d/t PFO w/ some residual memory loss; only took ASA for about a year, no statins or blood thinners. Bipolar affective Depression/Anxiety  Remainder reviewed in history tab.   Pertinent Past Surgical History: 2008 - PFO repair  Remainder reviewed in history tab.   Pertinent Social History: Tobacco use: Yes, once a week cigar for 3-4 yrs Alcohol use: Beer - once a day Other Substance use: none Lives alone.   Pertinent Family History: Mother - alive; stroke Father - deceased; heart disease and asthma Sister - alive; stroke, MS  Remainder reviewed in history tab.   Important Outpatient Medications: Hydroxyine '25mg'$  TID Mirtazapine '15mg'$  qhs Oxcarbazepine '300mg'$  BID Risperidone '2mg'$  qhs  Remainder reviewed in medication history.   Objective: BP 116/68   Pulse 91   Temp 99.4 F (37.4 C) (Oral)   Resp (!) 21   Ht 6' (1.829 m)   Wt 88.5 kg   SpO2 98%   BMI 26.45 kg/m  Exam: General: Alert male, laying comfortably in bed in NAD. Cardiovascular: RRR. No M/R/G. Respiratory: normal effort on room air, lungs CTAB. No wheezing or crackles. Gastrointestinal: Soft,  nontender, nondistended. Normal bowel movements.  MSK: L leg with 2+ nonpitting edema up to thigh and mildly increased warmth. Mild TTP throughout LLE. Psych: Mood and affect stable. Neuro: CN intact. Motor and sensation intact globally.  Labs:  CBC BMET  Recent  Labs  Lab 02/13/22 0149  WBC 12.6*  HGB 13.0  HCT 37.6*  PLT 183   Recent Labs  Lab 02/13/22 0149  NA 134*  K 3.5  CL 97*  CO2 24  BUN 15  CREATININE 1.18  GLUCOSE 128*  CALCIUM 9.5    Pertinent additional labs - D-dimer: >20   EKG: Pending  Imaging Studies Performed: 12/2 - Chest XR  Impression: No active cardiopulmonary disease  12/2 - L Hip XR  Impression: Negative  12/2 - CT Head w/o contrast  Impression: No acute finding. Remote bifrontal infarct.  12/2 - CTA Impression: Acute panlobar pulmonary emboli. No pulmonary infarct or right heart dilatation  12/2 - Lower Venous DVT Study Impression: Findings consistent with acute deep vein thrombosis involving the left  common femoral vein, left femoral vein, left proximal profunda vein, left popliteal vein, left posterior tibial veins, left peroneal veins, left gastrocnemius veins, external iliac  and common iliac veins.Marland Kitchen Ultrasound characteristics of enlarged lymph nodes noted in the groin.    Britt Bolognese, Medical Student Colletta [place], MD PGY-1 02/13/2022, 1:15 PM [place] Intern pager: [phone], text pages welcome Secure chat group [place] Upper-Level Resident Addendum   I have independently interviewed and examined the patient. I have discussed the above with Dr. Jerilee Hoh and agree with the documented plan. My edits for correction/addition/clarification are included above. Please see any attending notes.   Alcus Dad, MD PGY-3, [facility] 02/13/2022 6:39 PM  [place] Service pager: [phone] (text pages welcome through [place])

## 2022-02-13 NOTE — Assessment & Plan Note (Deleted)
Patient has had 2 week VSS, no hypoxemia. Vascular US showed left leg DVT.  - Vascular surgery consulted, pending recommendations

## 2022-02-14 ENCOUNTER — Encounter (HOSPITAL_COMMUNITY): Payer: Self-pay | Admitting: Family Medicine

## 2022-02-14 DIAGNOSIS — I82402 Acute embolism and thrombosis of unspecified deep veins of left lower extremity: Secondary | ICD-10-CM | POA: Diagnosis not present

## 2022-02-14 DIAGNOSIS — I824Y2 Acute embolism and thrombosis of unspecified deep veins of left proximal lower extremity: Secondary | ICD-10-CM

## 2022-02-14 DIAGNOSIS — R7401 Elevation of levels of liver transaminase levels: Secondary | ICD-10-CM | POA: Insufficient documentation

## 2022-02-14 LAB — COMPREHENSIVE METABOLIC PANEL
ALT: 59 U/L — ABNORMAL HIGH (ref 0–44)
AST: 50 U/L — ABNORMAL HIGH (ref 15–41)
Albumin: 2.9 g/dL — ABNORMAL LOW (ref 3.5–5.0)
Alkaline Phosphatase: 55 U/L (ref 38–126)
Anion gap: 12 (ref 5–15)
BUN: 14 mg/dL (ref 6–20)
CO2: 26 mmol/L (ref 22–32)
Calcium: 8.9 mg/dL (ref 8.9–10.3)
Chloride: 96 mmol/L — ABNORMAL LOW (ref 98–111)
Creatinine, Ser: 1.1 mg/dL (ref 0.61–1.24)
GFR, Estimated: >60 mL/min (ref >60)
Glucose, Bld: 103 mg/dL — ABNORMAL HIGH (ref 70–99)
Potassium: 4 mmol/L (ref 3.5–5.1)
Sodium: 134 mmol/L — ABNORMAL LOW (ref 135–145)
Total Bilirubin: 1 mg/dL (ref 0.3–1.2)
Total Protein: 7.4 g/dL (ref 6.5–8.1)

## 2022-02-14 LAB — CBC
HCT: 34.9 % — ABNORMAL LOW (ref 39.0–52.0)
Hemoglobin: 11.9 g/dL — ABNORMAL LOW (ref 13.0–17.0)
MCH: 29.6 pg (ref 26.0–34.0)
MCHC: 34.1 g/dL (ref 30.0–36.0)
MCV: 86.8 fL (ref 80.0–100.0)
Platelets: 207 10*3/uL (ref 150–400)
RBC: 4.02 MIL/uL — ABNORMAL LOW (ref 4.22–5.81)
RDW: 12.3 % (ref 11.5–15.5)
WBC: 9.8 10*3/uL (ref 4.0–10.5)
nRBC: 0 % (ref 0.0–0.2)

## 2022-02-14 LAB — HIV ANTIBODY (ROUTINE TESTING W REFLEX): HIV Screen 4th Generation wRfx: NONREACTIVE

## 2022-02-14 LAB — HEPARIN LEVEL (UNFRACTIONATED)
Heparin Unfractionated: 0.28 IU/mL — ABNORMAL LOW (ref 0.30–0.70)
Heparin Unfractionated: 0.54 IU/mL (ref 0.30–0.70)

## 2022-02-14 MED ORDER — APIXABAN 5 MG PO TABS: 5.0000 mg | ORAL_TABLET | Freq: Two times a day (BID) | ORAL | Status: DC

## 2022-02-14 MED ORDER — ROSUVASTATIN CALCIUM 10 MG PO TABS: 10.0000 mg | ORAL_TABLET | Freq: Every day | ORAL | 0 refills | Status: DC

## 2022-02-14 MED ORDER — APIXABAN 5 MG PO TABS: 10.0000 mg | ORAL_TABLET | Freq: Two times a day (BID) | ORAL | 0 refills | Status: DC

## 2022-02-14 MED ORDER — APIXABAN 5 MG PO TABS: 5.0000 mg | ORAL_TABLET | Freq: Two times a day (BID) | ORAL | 0 refills | Status: DC

## 2022-02-14 MED ORDER — ROSUVASTATIN CALCIUM 5 MG PO TABS
10.0000 mg | ORAL_TABLET | Freq: Every day | ORAL | Status: DC
  Administered 2022-02-14: 10 mg via ORAL
  Filled 2022-02-14: qty 2

## 2022-02-14 MED ORDER — APIXABAN 5 MG PO TABS
10.0000 mg | ORAL_TABLET | Freq: Two times a day (BID) | ORAL | Status: DC
  Administered 2022-02-14: 10 mg via ORAL
  Filled 2022-02-14: qty 2

## 2022-02-14 MED ORDER — ACETAMINOPHEN 325 MG PO TABS
650.0000 mg | ORAL_TABLET | Freq: Four times a day (QID) | ORAL | Status: DC
  Administered 2022-02-14 (×2): 650 mg via ORAL
  Filled 2022-02-14 (×2): qty 2

## 2022-02-14 NOTE — Progress Notes (Addendum)
ANTICOAGULATION CONSULT NOTE - Follow Up Consult  Pharmacy Consult for Heparin infusion Indication: pulmonary embolus and LLE DVT  No Known Allergies  Patient Measurements: Height: 6' (182.9 cm) Weight: 88.5 kg (195 lb) IBW/kg (Calculated) : 77.6 Heparin Dosing Weight: 88.5 kg  Vital Signs: Temp: 97.6 F (36.4 C) (12/03 0453) Temp Source: Axillary (12/03 0453) BP: 117/76 (12/03 1105) Pulse Rate: 90 (12/03 1105)  Labs: Recent Labs    02/13/22 0149 02/13/22 1630 02/14/22 0456  HGB 13.0  --  11.9*  HCT 37.6*  --  34.9*  PLT 183  --  207  HEPARINUNFRC  --  0.36 0.28*  CREATININE 1.18  --  1.10    Estimated Creatinine Clearance: 78.4 mL/min (by C-G formula based on SCr of 1.1 mg/dL).   Medications:  Infusions:   heparin 1,850 Units/hr (02/14/22 7425)    Assessment: 61 yo M presenting to ED with panlobar PE and LLE DVT, no pulmonary infarct or R heart strain. Pt was not taking any anticoagulation prior to admission. Pharmacy was consulted to dose and manage heparin infusion for acute PE and LLE DVT.  Heparin level 0.54, therapeutic Current heparin infusion rate: 1850 units/hr  Hgb 11.9, trending down slightly Plt 207, trending up No s/sx of bleeding. No interruptions to heparin infusion reported by RN.  Goal of Therapy:  Heparin level 0.3-0.7 units/ml Monitor platelets by anticoagulation protocol: Yes   Plan:  Continue heparin infusion at 1850 units/hr  Check heparin level in 6 hours Monitor daily CBC, heparin level, and for s/sx of bleeding F/u plan for transition to oral anticoagulation.  Luisa Hart, PharmD, BCPS Clinical Pharmacist 02/14/2022 16:00   Please refer to [facility] for pharmacy phone number   ____________________________________________________ Addendum:   Heparin transitioned to apixaban '10mg'$  BID x7 days, followed by '5mg'$  BID. Heparin discontinued at time of apixaban administration. Pharmacist provided pt with apixaban copay-assistance card  and provided education.   Luisa Hart, PharmD, BCPS Clinical Pharmacist 02/14/2022 8:34 PM   Please refer to [facility] for pharmacy phone number

## 2022-02-14 NOTE — ED Notes (Signed)
Pt heparin restarted, medication paused for less than 1hr d/t bag emptying. Patient sleeping in bed. Chest rise and fall seen. No signs of distress noted.

## 2022-02-14 NOTE — Assessment & Plan Note (Addendum)
Slightly elevated AST and ALT. Etiology unknown at this time, but has been elevated in past.  -Continue to monitor -AM CMP

## 2022-02-14 NOTE — Progress Notes (Signed)
PT Cancellation Note  Patient Details Name: Andrew Kaiser MRN: [id number] DOB: [birth year]   Cancelled Treatment:    Reason Eval/Treat Not Completed: Other (comment) Pt recently arrived to [facility] from ED and sleeping soundly; did not arouse to name call. Will follow up.  Wyona Almas, PT, DPT Acute Rehabilitation Services Office [phone]    Deno Etienne 02/14/2022, 3:30 PM

## 2022-02-14 NOTE — Progress Notes (Signed)
Patient discharged to go home via taxi. Case management voucher filled out and given to cab. Patient able to verbalize when to take next dose of eliquis and how to use voucher at his pharmacy.  Erling Conte, RN

## 2022-02-14 NOTE — ED Notes (Addendum)
Pt ambulated in the hallway, pt SPO2 97-98%,  HR increased to 116-118 while ambulating, HR back to 90s at rest in bed, MD notified.

## 2022-02-14 NOTE — Discharge Summary (Signed)
[facility] Discharge Summary  Patient name: Andrew Kaiser Medical record number: [id number] Date of birth: 1960/12/22 Age: 61 y.o. Gender: male Date of Admission: 02/12/2022  Date of Discharge: 02/14/2022 Admitting Physician: Lind Covert, MD  Primary Care Provider: [facility]: Vascular surgery  Indication for Hospitalization: Leg swelling, shortness of breath  Discharge Diagnoses/Problem List:  Principal Problem for Admission: PE, DVT Other Problems addressed during stay:  Principal Problem:   Acute embolism and thrombosis of unspecified deep veins of left lower extremity ([place]) Active Problems:   Cerebral vascular disease   Acute deep vein thrombosis (DVT) of proximal vein of left lower extremity ([place])   Pulmonary embolism ([place])   Transaminitis  Brief Hospital Course:  Andrew Kaiser is a 61 y.o. male with past medical history of bipolar disorder, anxiety, and depression presented to ED with left leg pain, swelling, and shortness of breath for two weeks which has been constant and worse with movements admitted for LLE DVT with bilateral PE. Hospital course is outlined below:  LLE DVT with Bilateral PE Patient presented to the ED with two weeks of left leg swelling, pain, and shortness of breath over the past two weeks. In the ED, patient was worked up for a DVT and PE. His D-dimer was positive (>20) and CTA showed acute pan lobar pulmonary emboli without pulmonary infarct or right heart dilatation.  CT venogram abdomen/pelvis did not reveal any acute abdominal pelvic process however did show some 1 cm renal cysts which did not recommend follow-up imaging.  Venous doppler demonstrated an extensive L leg DVT.  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 He was stable to be discharged on  Eliquis. Advised to pick up Eliquis Rx at pharmacy the following day as he was discharged on a weekend and medications could not be provided via hospital pharmacy.   Other chronic, stable conditions (CVA, bipolar, anxiety) were managed without changes.  PCP Follow-Up Recommendations Consider rechecking lipid profile after 6 to 8 weeks of statin which was initiated during this hospitalization Consider adjusting Trileptal as there is a drug interaction between this and Eliquis, this was advised to patient as well  Disposition: Home  Discharge Condition: Home stable  Discharge Exam:  Vitals:   02/14/22 1400 02/14/22 1431  BP: 117/78 128/77  Pulse: 99 85  Resp: 16 16  Temp:  98.8 F (37.1 C)  SpO2: 98% 96%  General: NAD, comfortable, African American male Cardiovascular: RRR, NRMG, S1/S2 Respiratory: CTABL, no wheezes or stridor Abdomen: Soft, NTTP, non-distended Extremities: Moving all independently, no swelling in LLE  Significant Procedures: None  Significant Labs and Imaging:  Recent Labs  Lab 02/13/22 0149 02/14/22 0456  WBC 12.6* 9.8  HGB 13.0 11.9*  HCT 37.6* 34.9*  PLT 183 207   Recent Labs  Lab 02/13/22 0149 02/14/22 0456  NA 134* 134*  K 3.5 4.0  CL 97* 96*  CO2 24 26  GLUCOSE 128* 103*  BUN 15 14  CREATININE 1.18 1.10  CALCIUM 9.5 8.9  ALKPHOS 62 55  AST 39 50*  ALT 46* 59*  ALBUMIN 3.5 2.9*   CTA PE: Acute panlobar pulmonary emboli. No pulmonary infarct or right heart dilatation.  Results/Tests Pending at Time of Discharge: None  Discharge Medications:  Allergies as of 02/14/2022   No Known Allergies      Medication List  TAKE these medications    apixaban 5 MG Tabs tablet Commonly known as: ELIQUIS Take 2 tablets (10 mg total) by mouth 2 (two) times daily for 7 days.   apixaban 5 MG Tabs tablet Commonly known as: ELIQUIS Take 1 tablet (5 mg total) by mouth 2 (two) times daily. Start taking on: February 21, 2022   hydrOXYzine  25 MG tablet Commonly known as: ATARAX Take 25-50 mg by mouth See admin instructions. Take '25mg'$  by mouth three times a day. Take '50mg'$  daily at bedtime as needed for sleep or anxiety.   mirtazapine 15 MG tablet Commonly known as: REMERON TAKE 1 TABLET BY MOUTH AT BEDTIME What changed: how much to take   Oxcarbazepine 300 MG tablet Commonly known as: TRILEPTAL Take 300 mg by mouth 2 (two) times daily.   risperiDONE 2 MG tablet Commonly known as: RISPERDAL Take 2 mg by mouth at bedtime. What changed: Another medication with the same name was removed. Continue taking this medication, and follow the directions you see here.   rosuvastatin 10 MG tablet Commonly known as: CRESTOR Take 1 tablet (10 mg total) by mouth daily. Start taking on: February 15, 2022       Discharge Instructions: Please refer to Patient Instructions section of EMR for full details.  Patient was counseled important signs and symptoms that should prompt return to medical care, changes in medications, dietary instructions, activity restrictions, and follow up appointments.   Follow-Up Appointments: Vascular surgery office to call for outpatient percutaneous thrombectomy.  Wells Guiles, DO 02/14/2022, 5:06 PM PGY-2, [place]

## 2022-02-14 NOTE — Consult Note (Addendum)
Hospital Consult    Reason for Consult: Left lower extremity extensive DVT Requesting Physician: ED MRN #:  [id number]  History of Present Illness: This is a 61 y.o. male with history outlined below who presented to the hospital with a 2-week history of left lower extremity pain.  This pain was described as constant, worsening with ambulation.  He is also appreciated significant swelling throughout the entirety of the left lower extremity.  Andrew Kaiser denies recent immobility, travel, history of hypercoagulability, family history of blood clots.  No prior history of DVT.  Vascular surgery was called for management.  On exam, Andrew Kaiser was comfortable, no complaints.  Stated he was able to ambulate, however this was with pain.  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 Authorizing Provider  hydrOXYzine (ATARAX) 25 MG tablet Take 25-50 mg by mouth See admin instructions. Take '25mg'$  by mouth three times a day. Take '50mg'$  daily at bedtime as needed for sleep or anxiety. 12/31/21  Yes [provider]  mirtazapine (REMERON) 15 MG tablet TAKE 1 TABLET BY MOUTH AT BEDTIME Patient taking differently: Take 15 mg by mouth at bedtime. 04/03/20 02/13/22 Yes   Oxcarbazepine (TRILEPTAL) 300 MG tablet Take 300 mg by mouth 2 (two) times daily.   Yes [provider]  risperiDONE (RISPERDAL) 2 MG tablet Take 2 mg by mouth at bedtime. 12/31/21  Yes [provider]  risperiDONE (RISPERDAL) 0.5 MG tablet TAKE 1 TABLET BY MOUTH DAILY AT BEDTIME. Patient not taking: Reported on  02/13/2022 04/03/20 02/13/22      Social History   Socioeconomic History   Marital status: Divorced    Spouse name: Not on file   Number of children: 1   Years of education: 15   Highest education level: Not on file  Occupational History   Occupation: unemployed  Tobacco Use   Smoking status: Former    Types: Cigars   Smokeless tobacco: Never  Vaping Use   Vaping Use: Never used  Substance and Sexual Activity   Alcohol use: Yes    Alcohol/week: 0.0 standard drinks of alcohol    Comment: socially   Drug use: No   Sexual activity: Not on file  Other Topics Concern   Not on file  Social History Narrative   Patient drinks caffeine occasionally.   Patient is left handed.   Social Determinants of Health   Financial Resource Strain: High Risk (11/24/2021)   Overall Financial Resource Strain (CARDIA)    Difficulty of Paying Living Expenses: Hard  Food Insecurity: No Food Insecurity (11/24/2021)   Hunger Vital Sign    Worried About Running Out of Food in the Last Year: Never true    Ran Out of Food in the Last Year: Never true  Transportation Needs: Unmet Transportation Needs (11/24/2021)   PRAPARE - Hydrologist (Medical): Yes    Lack of Transportation (Non-Medical): Yes  Physical Activity: Sufficiently Active (11/24/2021)   Exercise Vital Sign    Days of Exercise per Week: 7 days    Minutes of Exercise per Session: 30 min  Stress: Stress Concern Present (11/24/2021)   [place]    Feeling of Stress : To some extent  Social Connections: Socially Isolated (11/24/2021)   Social Connection and Isolation Panel [NHANES]    Frequency of Communication with Friends and Family: More than three times a week    Frequency of Social Gatherings with Friends and Family: More than three times a week    Attends Religious Services: Never    Marine scientist or Organizations: No    Attends English as a second language teacher Meetings: Never    Marital Status: Divorced  Human resources officer Violence: Not At Risk (11/24/2021)   Humiliation, Afraid, Rape, and Kick questionnaire    Fear of Current or Ex-Partner: No    Emotionally Abused: No    Physically Abused: No    Sexually Abused: No   Family History  Problem Relation Age of Onset   Stroke Mother    Heart disease Father    Asthma Father    Stroke Sister    Multiple sclerosis Sister    Colon cancer Neg Hx    Pancreatic cancer Neg Hx    Rectal cancer Neg Hx    Stomach cancer Neg Hx    Esophageal cancer Neg Hx    Prostate cancer Neg Hx    Colon polyps Neg Hx     ROS: Otherwise negative unless mentioned in HPI  Physical Examination  Vitals:   02/14/22 1213 02/14/22 1315  BP:  116/73  Pulse:  81  Resp:  20  Temp: 99.2 F (37.3 C)   SpO2:  98%   Body mass index is 26.45 kg/m.  General:  WDWN in NAD Gait: Not observed HENT: WNL, normocephalic Pulmonary: normal non-labored breathing, without Rales, rhonchi,  wheezing Cardiac: regular Abdomen: soft, NT/ND, no masses Skin: without rashes Vascular Exam/Pulses: 2+ DP bilaterally Extremities: without ischemic changes, without Gangrene , without cellulitis; without open wounds;  Musculoskeletal: no muscle wasting or atrophy  Neurologic: A&O X 3;  No focal weakness or paresthesias are detected; speech is fluent/normal Psychiatric:  The pt has Normal affect. Lymph:  Unremarkable  CBC    Component Value Date/Time   WBC 9.8 02/14/2022 0456   RBC 4.02 (L) 02/14/2022 0456   HGB 11.9 (L) 02/14/2022 0456   HGB 14.2 06/06/2019 1152   HCT 34.9 (L) 02/14/2022 0456   HCT 42.4 06/06/2019 1152   PLT 207 02/14/2022 0456   PLT 214 06/06/2019 1152   MCV 86.8 02/14/2022 0456   MCV 88 06/06/2019 1152   MCH 29.6 02/14/2022 0456   MCHC 34.1 02/14/2022 0456   RDW 12.3 02/14/2022 0456   RDW 12.9 06/06/2019 1152   LYMPHSABS 0.9 02/13/2022 0149   LYMPHSABS 1.5 06/06/2019 1152   MONOABS 1.0  02/13/2022 0149   EOSABS 0.0 02/13/2022 0149   EOSABS 0.2 06/06/2019 1152   BASOSABS 0.0 02/13/2022 0149   BASOSABS 0.0 06/06/2019 1152    BMET    Component Value Date/Time   NA 134 (L) 02/14/2022 0456   NA 143 06/06/2019 1152   K 4.0 02/14/2022 0456   CL 96 (L) 02/14/2022 0456   CO2 26 02/14/2022 0456   GLUCOSE 103 (H) 02/14/2022 0456   BUN 14 02/14/2022 0456   BUN 23 06/06/2019 1152  CREATININE 1.10 02/14/2022 0456   CALCIUM 8.9 02/14/2022 0456   GFRNONAA >60 02/14/2022 0456   GFRAA 79 06/06/2019 1152    COAGS: No results found for: "INR", "PROTIME"    ASSESSMENT/PLAN: This is a 61 y.o. male presenting with a 2-week history of left lower extremity swelling and pain with ambulation.  Imaging demonstrates extensive left lower extremity DVT extending from the common iliac vein into the tibial veins.  On physical exam, he had a palpable pulse in the foot, no concern for phlegmasia.  There is some asymmetric swelling as compared to the right.  I had a long conversation with Andrew Kaiser.  We discussed medical management versus percutaneous thrombectomy with possible stenting for May Thurner.  After discussing the risk and benefits of both, Andrew Kaiser elected to proceed with percutaneous thrombectomy.  Unfortunately, the service line does not have availability until Wednesday.  I will discuss this with Andrew Kaiser shortly, and will have him present as an outpatient.  He can be transition to a DOAC, and continue that [place] to the time of his intervention.  I will have my office call Andrew Kaiser and set up his outpatient procedure.  Pt felt comfortable with proceeding with the plan above.   [name] MD MS [facility] [phone] 02/14/2022  1:49 PM

## 2022-02-14 NOTE — Progress Notes (Signed)
[place] for heparin Indication: pulmonary embolus, LLE DVT  Heparin Dosing Weight: 88.5 kg  Labs: Recent Labs    02/13/22 0149 02/13/22 1630 02/14/22 0456  HGB 13.0  --  11.9*  HCT 37.6*  --  34.9*  PLT 183  --  207  HEPARINUNFRC  --  0.36 0.28*  CREATININE 1.18  --  1.10     Assessment: 82 yom presenting with acute panlobar PE and LLE DVT, no pulmonary infarct or R heart dilatation. Pharmacy consulted to dose heparin. Imaging negative for bleed. Patient is not on anticoagulation PTA. CBC wnl.  12/2 PM Initial heparin level at goal, however at low end (0.36). No bleeding or IV issues noted.  12/3 AM update:  Heparin level just below goal now  Goal of Therapy:  Heparin level 0.3-0.7 units/ml Monitor platelets by anticoagulation protocol: Yes   Plan:  Inc heparin to 1850 units/hr 1300 heparin level  Narda Bonds, PharmD, BCPS Clinical Pharmacist Phone: [phone]

## 2022-02-14 NOTE — Discharge Instructions (Addendum)
Dear Andrew Kaiser,   Thank you for letting us participate in your care! In this section, you will find a brief hospital admission summary of why you were admitted to the hospital, what happened during your admission, your diagnosis/diagnoses, and recommended follow up.  Primary diagnosis: Pulmonary embolism and left lower extremity DVT Treatment plan: You received anticoagulation via heparin.  Vascular surgery was consulted and recommended a procedure to help remove the clot and potentially place a stent as well.  They will contact you for follow-up regarding this.  Additionally you were transitioned to Eliquis, please do not miss any doses.  You are to take Eliquis 10 mg twice daily x 7 days and then 5 mg twice daily.  POST-HOSPITAL & CARE INSTRUCTIONS We recommend following up with your PCP within 1 week from being discharged from the hospital. Please let PCP/Specialists know of any changes in medications that were made which you will be able to see in the medications section of this packet. Please also follow up with vascular surgery as recommended by their team.  [facility] Future Appointments  Date Time Provider [place]  02/18/2022  3:30 PM Alwyn Ren, [facility] None     Thank you for choosing [facility]! Take care and be well!  [facility]  [place], [place] [address] [phone]    Information on my medicine - ELIQUIS (apixaban)  Why was Eliquis prescribed for you? Eliquis was prescribed to treat blood clots that may have been found in the veins of your legs (deep vein thrombosis) or in your lungs (pulmonary embolism) and to reduce the risk of them occurring again.  What do You need to know about Eliquis ? The starting dose is 10 mg (two 5 mg tablets) taken TWICE daily for the FIRST SEVEN (7) DAYS, then on 02/21/22 the dose is  reduced to ONE 5 mg tablet taken TWICE daily.  Eliquis may be taken with or without food.   Try to take the dose about the same time in the morning and in the evening. If you have difficulty swallowing the tablet whole please discuss with your pharmacist how to take the medication safely.  Take Eliquis exactly as prescribed and DO NOT stop taking Eliquis without talking to the doctor who prescribed the medication.  Stopping may increase your risk of developing a new blood clot.  Refill your prescription before you run out.  After discharge, you should have regular check-up appointments with your healthcare provider that is prescribing your Eliquis.    What do you do if you miss a dose? If a dose of ELIQUIS is not taken at the scheduled time, take it as soon as possible on the same day and twice-daily administration should be resumed. The dose should not be doubled to make up for a missed dose.  Important Safety Information A possible side effect of Eliquis is bleeding. You should call your healthcare provider right away if you experience any of the following: Bleeding from an injury or your nose that does not stop. Unusual colored urine (red or dark brown) or unusual colored stools (red or black). Unusual bruising for unknown reasons. A serious fall or if you hit your head (even if there is no bleeding).  Some medicines may interact with Eliquis and might increase your risk of bleeding or clotting while on Eliquis. To help avoid this, consult  your healthcare provider or pharmacist prior to using any new prescription or non-prescription medications, including herbals, vitamins, non-steroidal anti-inflammatory drugs (NSAIDs) and supplements.  This website has more information on Eliquis (apixaban): http://www.eliquis.com/eliquis/home

## 2022-02-14 NOTE — Evaluation (Signed)
Physical Therapy Evaluation Patient Details Name: Andrew Kaiser MRN: [id number] DOB: 09-01-1960 Today's Date: 02/14/2022  History of Present Illness  Pt is a 61 y.o. M who presents 02/12/2022 with left leg pain and swelling and shortness of breath for two weeks and found to have LLE DVT with bilateral PE. Significant PMH: bipolar disorder.  Clinical Impression  PTA, pt lives alone in a level entry house and is independent. Pt presents with LLE pain, increased edema, and gait abnormalities. Pt ambulating 75 ft with no assistive device or physical assist. SpO2 98% on RA, HR 78-110 bpm. Education provided regarding elevation and activity recommendations/progression. Don't anticipate pt will need PT follow up. Will continue to follow acutely.     Recommendations for follow up therapy are one component of a multi-disciplinary discharge planning process, led by the attending physician.  Recommendations may be updated based on patient status, additional functional criteria and insurance authorization.  Follow Up Recommendations No PT follow up      Assistance Recommended at Discharge PRN  Patient can return home with the following  Assist for transportation    Equipment Recommendations None recommended by PT  Recommendations for Other Services       Functional Status Assessment Patient has had a recent decline in their functional status and demonstrates the ability to make significant improvements in function in a reasonable and predictable amount of time.     Precautions / Restrictions Precautions Precautions: None Restrictions Weight Bearing Restrictions: No      Mobility  Bed Mobility Overal bed mobility: Independent                  Transfers Overall transfer level: Independent Equipment used: None                    Ambulation/Gait Ambulation/Gait assistance: Modified independent (Device/Increase time) Gait Distance (Feet): 75 Feet Assistive device:  None Gait Pattern/deviations: Step-through pattern, Antalgic, Decreased weight shift to left Gait velocity: decreased     General Gait Details: Antalgic gait pattern, decreased weight shift to left, no gross unsteadiness noted  Stairs            Wheelchair Mobility    Modified Rankin (Stroke Patients Only)       Balance Overall balance assessment: Mild deficits observed, not formally tested                                           Pertinent Vitals/Pain Pain Assessment Pain Assessment: Faces Faces Pain Scale: Hurts whole lot Pain Location: LLE Pain Descriptors / Indicators: Grimacing, Guarding, Sharp Pain Intervention(s): Limited activity within patient's tolerance, Monitored during session, Patient requesting pain meds-RN notified    Home Living Family/patient expects to be discharged to:: Private residence Living Arrangements: Alone Available Help at Discharge: Friend(s) Type of Home: House Home Access: Level entry                Prior Function Prior Level of Function : Independent/Modified Independent             Mobility Comments: Does not work       Journalist, newspaper        Extremity/Trunk Assessment   Upper Extremity Assessment Upper Extremity Assessment: Overall WFL for tasks assessed    Lower Extremity Assessment Lower Extremity Assessment: LLE deficits/detail LLE Deficits / Details: Increased edema    Cervical /  Trunk Assessment Cervical / Trunk Assessment: Normal  Communication   Communication: No difficulties  Cognition Arousal/Alertness: Awake/alert Behavior During Therapy: WFL for tasks assessed/performed Overall Cognitive Status: Within Functional Limits for tasks assessed                                          General Comments      Exercises     Assessment/Plan    PT Assessment Patient needs continued PT services  PT Problem List Decreased strength;Decreased activity  tolerance;Decreased balance;Decreased mobility;Pain       PT Treatment Interventions DME instruction;Stair training;Gait training;Functional mobility training;Therapeutic exercise;Therapeutic activities;Balance training;Patient/family education    PT Goals (Current goals can be found in the Care Plan section)  Acute Rehab PT Goals Patient Stated Goal: less left leg pain PT Goal Formulation: With patient Time For Goal Achievement: 02/28/22 Potential to Achieve Goals: Good    Frequency Min 3X/week     Co-evaluation               AM-PAC PT "6 Clicks" Mobility  Outcome Measure Help needed turning from your back to your side while in a flat bed without using bedrails?: None Help needed moving from lying on your back to sitting on the side of a flat bed without using bedrails?: None Help needed moving to and from a bed to a chair (including a wheelchair)?: None Help needed standing up from a chair using your arms (e.g., wheelchair or bedside chair)?: None Help needed to walk in hospital room?: A Little Help needed climbing 3-5 steps with a railing? : A Little 6 Click Score: 22    End of Session   Activity Tolerance: Patient tolerated treatment well Patient left: in bed;with call bell/phone within reach Nurse Communication: Mobility status PT Visit Diagnosis: Unsteadiness on feet (R26.81);Other abnormalities of gait and mobility (R26.89);Pain Pain - Right/Left: Left Pain - part of body: Leg    Time: 7867-6720 PT Time Calculation (min) (ACUTE ONLY): 14 min   Charges:   PT Evaluation $PT Eval Low Complexity: [place], PT, DPT Acute Rehabilitation Services Office [phone]   Deno Etienne 02/14/2022, 4:46 PM

## 2022-02-15 ENCOUNTER — Other Ambulatory Visit: Payer: Self-pay

## 2022-02-15 DIAGNOSIS — I824Y2 Acute embolism and thrombosis of unspecified deep veins of left proximal lower extremity: Secondary | ICD-10-CM

## 2022-02-16 ENCOUNTER — Telehealth: Payer: Self-pay

## 2022-02-16 NOTE — Telephone Encounter (Signed)
Pt called stating that he was having a procedure tomorrow and he needed his blood thinner, but the pharmacy didn't have it.  Reviewed pt's chart, returned call for clarification, two identifiers used. Pt stated it was Eliquis. Informed him that it was in the system, but I would call his pharmacy to investigate.  Called pharmacy. There were 2 orders with differing amounts. Clarified orders. Pharmacy to fill now.  Called pt and informed him that his pharmacy was filling. Confirmed understanding.

## 2022-02-17 ENCOUNTER — Observation Stay (HOSPITAL_COMMUNITY)
Admission: RE | Admit: 2022-02-17 | Discharge: 2022-02-18 | Disposition: A | Discharge status: Home / Self Care | Payer: Medicaid Other | Attending: Vascular Surgery | Admitting: Vascular Surgery

## 2022-02-17 ENCOUNTER — Ambulatory Visit (HOSPITAL_COMMUNITY)
Admission: RE | Disposition: A | Discharge status: Home / Self Care | Payer: Self-pay | Source: Home / Self Care | Attending: Vascular Surgery

## 2022-02-17 ENCOUNTER — Other Ambulatory Visit: Payer: Self-pay

## 2022-02-17 DIAGNOSIS — Z8673 Personal history of transient ischemic attack (TIA), and cerebral infarction without residual deficits: Secondary | ICD-10-CM | POA: Diagnosis not present

## 2022-02-17 DIAGNOSIS — Z79899 Other long term (current) drug therapy: Secondary | ICD-10-CM | POA: Insufficient documentation

## 2022-02-17 DIAGNOSIS — Z23 Encounter for immunization: Secondary | ICD-10-CM | POA: Diagnosis not present

## 2022-02-17 DIAGNOSIS — I82412 Acute embolism and thrombosis of left femoral vein: Secondary | ICD-10-CM | POA: Diagnosis present

## 2022-02-17 DIAGNOSIS — I82422 Acute embolism and thrombosis of left iliac vein: Secondary | ICD-10-CM | POA: Insufficient documentation

## 2022-02-17 DIAGNOSIS — I824Y2 Acute embolism and thrombosis of unspecified deep veins of left proximal lower extremity: Secondary | ICD-10-CM

## 2022-02-17 DIAGNOSIS — Z87891 Personal history of nicotine dependence: Secondary | ICD-10-CM | POA: Insufficient documentation

## 2022-02-17 HISTORY — PX: PERIPHERAL VASCULAR THROMBECTOMY: CATH118306

## 2022-02-17 HISTORY — PX: PERIPHERAL VASCULAR BALLOON ANGIOPLASTY: CATH118281

## 2022-02-17 LAB — POCT I-STAT, CHEM 8
BUN: 15 mg/dL (ref 6–20)
Calcium, Ion: 1.13 mmol/L — ABNORMAL LOW (ref 1.15–1.40)
Chloride: 99 mmol/L (ref 98–111)
Creatinine, Ser: 1 mg/dL (ref 0.61–1.24)
Glucose, Bld: 106 mg/dL — ABNORMAL HIGH (ref 70–99)
HCT: 34 % — ABNORMAL LOW (ref 39.0–52.0)
Hemoglobin: 11.6 g/dL — ABNORMAL LOW (ref 13.0–17.0)
Potassium: 3.5 mmol/L (ref 3.5–5.1)
Sodium: 135 mmol/L (ref 135–145)
TCO2: 25 mmol/L (ref 22–32)

## 2022-02-17 LAB — CBC
HCT: 30.8 % — ABNORMAL LOW (ref 39.0–52.0)
Hemoglobin: 10.5 g/dL — ABNORMAL LOW (ref 13.0–17.0)
MCH: 29.2 pg (ref 26.0–34.0)
MCHC: 34.1 g/dL (ref 30.0–36.0)
MCV: 85.6 fL (ref 80.0–100.0)
Platelets: 332 10*3/uL (ref 150–400)
RBC: 3.6 MIL/uL — ABNORMAL LOW (ref 4.22–5.81)
RDW: 12.1 % (ref 11.5–15.5)
WBC: 10 10*3/uL (ref 4.0–10.5)
nRBC: 0 % (ref 0.0–0.2)

## 2022-02-17 SURGERY — PERIPHERAL VASCULAR THROMBECTOMY
Anesthesia: LOCAL

## 2022-02-17 MED ORDER — FENTANYL CITRATE (PF) 100 MCG/2ML IJ SOLN
INTRAMUSCULAR | Status: AC
  Filled 2022-02-17: qty 2

## 2022-02-17 MED ORDER — MIRTAZAPINE 15 MG PO TABS
15.0000 mg | ORAL_TABLET | Freq: Every day | ORAL | Status: DC
  Administered 2022-02-17: 15 mg via ORAL
  Filled 2022-02-17: qty 1

## 2022-02-17 MED ORDER — HEPARIN SODIUM (PORCINE) 1000 UNIT/ML IJ SOLN
INTRAMUSCULAR | Status: AC
  Filled 2022-02-17: qty 10

## 2022-02-17 MED ORDER — LABETALOL HCL 5 MG/ML IV SOLN: 10.0000 mg | INTRAVENOUS | Status: DC | PRN

## 2022-02-17 MED ORDER — HYDRALAZINE HCL 20 MG/ML IJ SOLN: 5.0000 mg | INTRAMUSCULAR | Status: DC | PRN

## 2022-02-17 MED ORDER — HYDROXYZINE HCL 25 MG PO TABS: 25.0000 mg | ORAL_TABLET | ORAL | Status: DC

## 2022-02-17 MED ORDER — POTASSIUM CHLORIDE CRYS ER 20 MEQ PO TBCR
20.0000 meq | EXTENDED_RELEASE_TABLET | Freq: Once | ORAL | Status: AC
  Administered 2022-02-17: 20 meq via ORAL
  Filled 2022-02-17: qty 1

## 2022-02-17 MED ORDER — IODIXANOL 320 MG/ML IV SOLN
INTRAVENOUS | Status: DC | PRN
  Administered 2022-02-17: 70 mL

## 2022-02-17 MED ORDER — SODIUM CHLORIDE 0.9 % IV SOLN: INTRAVENOUS | Status: DC

## 2022-02-17 MED ORDER — LIDOCAINE HCL (PF) 1 % IJ SOLN
INTRAMUSCULAR | Status: AC
  Filled 2022-02-17: qty 30

## 2022-02-17 MED ORDER — MIDAZOLAM HCL 2 MG/2ML IJ SOLN
INTRAMUSCULAR | Status: AC
  Filled 2022-02-17: qty 2

## 2022-02-17 MED ORDER — HEPARIN (PORCINE) IN NACL 1000-0.9 UT/500ML-% IV SOLN
INTRAVENOUS | Status: AC
  Filled 2022-02-17: qty 500

## 2022-02-17 MED ORDER — HYDROXYZINE HCL 25 MG PO TABS: 50.0000 mg | ORAL_TABLET | Freq: Every evening | ORAL | Status: DC | PRN

## 2022-02-17 MED ORDER — ALUM & MAG HYDROXIDE-SIMETH 200-200-20 MG/5ML PO SUSP: 15.0000 mL | ORAL | Status: DC | PRN

## 2022-02-17 MED ORDER — ONDANSETRON HCL 4 MG/2ML IJ SOLN: 4.0000 mg | Freq: Four times a day (QID) | INTRAMUSCULAR | Status: DC | PRN

## 2022-02-17 MED ORDER — RISPERIDONE 2 MG PO TABS
2.0000 mg | ORAL_TABLET | Freq: Every day | ORAL | Status: DC
  Administered 2022-02-17: 2 mg via ORAL
  Filled 2022-02-17 (×3): qty 1

## 2022-02-17 MED ORDER — MIDAZOLAM HCL 2 MG/2ML IJ SOLN
INTRAMUSCULAR | Status: DC | PRN
  Administered 2022-02-17 (×2): 1 mg via INTRAVENOUS

## 2022-02-17 MED ORDER — HEPARIN SODIUM (PORCINE) 1000 UNIT/ML IJ SOLN
INTRAMUSCULAR | Status: DC | PRN
  Administered 2022-02-17: 5000 [IU] via INTRAVENOUS
  Administered 2022-02-17: 4000 [IU] via INTRAVENOUS

## 2022-02-17 MED ORDER — OXCARBAZEPINE 300 MG PO TABS
300.0000 mg | ORAL_TABLET | Freq: Two times a day (BID) | ORAL | Status: DC
  Administered 2022-02-17 – 2022-02-18 (×2): 300 mg via ORAL
  Filled 2022-02-17 (×4): qty 1

## 2022-02-17 MED ORDER — HYDROXYZINE HCL 25 MG PO TABS
25.0000 mg | ORAL_TABLET | Freq: Three times a day (TID) | ORAL | Status: DC
  Administered 2022-02-17 – 2022-02-18 (×3): 25 mg via ORAL
  Filled 2022-02-17 (×3): qty 1

## 2022-02-17 MED ORDER — METOPROLOL TARTRATE 5 MG/5ML IV SOLN: 2.0000 mg | INTRAVENOUS | Status: DC | PRN

## 2022-02-17 MED ORDER — APIXABAN 5 MG PO TABS
10.0000 mg | ORAL_TABLET | Freq: Two times a day (BID) | ORAL | Status: DC
  Administered 2022-02-17 – 2022-02-18 (×3): 10 mg via ORAL
  Filled 2022-02-17 (×3): qty 2

## 2022-02-17 MED ORDER — ROSUVASTATIN CALCIUM 5 MG PO TABS
10.0000 mg | ORAL_TABLET | Freq: Every day | ORAL | Status: DC
  Administered 2022-02-17 – 2022-02-18 (×2): 10 mg via ORAL
  Filled 2022-02-17 (×2): qty 2

## 2022-02-17 MED ORDER — FENTANYL CITRATE (PF) 100 MCG/2ML IJ SOLN
INTRAMUSCULAR | Status: DC | PRN
  Administered 2022-02-17 (×2): 50 ug via INTRAVENOUS

## 2022-02-17 MED ORDER — HEPARIN (PORCINE) IN NACL 1000-0.9 UT/500ML-% IV SOLN
INTRAVENOUS | Status: DC | PRN
  Administered 2022-02-17: 500 mL

## 2022-02-17 MED ORDER — PHENOL 1.4 % MT LIQD: 1.0000 | OROMUCOSAL | Status: DC | PRN

## 2022-02-17 MED ORDER — PANTOPRAZOLE SODIUM 40 MG PO TBEC
40.0000 mg | DELAYED_RELEASE_TABLET | Freq: Every day | ORAL | Status: DC
  Administered 2022-02-17 – 2022-02-18 (×2): 40 mg via ORAL
  Filled 2022-02-17 (×2): qty 1

## 2022-02-17 MED ORDER — GUAIFENESIN-DM 100-10 MG/5ML PO SYRP: 15.0000 mL | ORAL_SOLUTION | ORAL | Status: DC | PRN

## 2022-02-17 SURGICAL SUPPLY — 18 items
BALLN MUSTANG 10X80X135 (BALLOONS) ×2
BALLN MUSTANG 12X60X135 (BALLOONS) ×2
BALLOON MUSTANG 10X80X135 (BALLOONS) IMPLANT
BALLOON MUSTANG 12X60X135 (BALLOONS) IMPLANT
CATH CLOT TRIEVER BOLD (CATHETERS) IMPLANT
CATH SYNTRAX .035X150 (CATHETERS) IMPLANT
CATH VISIONS PV .035 IVUS (CATHETERS) IMPLANT
GLIDEWIRE ADV .035X260CM (WIRE) IMPLANT
KIT ENCORE 26 ADVANTAGE (KITS) IMPLANT
KIT MICROPUNCTURE NIT STIFF (SHEATH) IMPLANT
PROTECTION STATION PRESSURIZED (MISCELLANEOUS) ×2
SHEATH CLOT RETRIEVER (SHEATH) IMPLANT
SHEATH PINNACLE 8F 10CM (SHEATH) IMPLANT
SHEATH PROBE COVER 6X72 (BAG) IMPLANT
STATION PROTECTION PRESSURIZED (MISCELLANEOUS) IMPLANT
TRAY PV CATH (CUSTOM PROCEDURE TRAY) IMPLANT
WIRE AMPLATZ SS-J .035X180CM (WIRE) IMPLANT
WIRE G VAS 035X260 STIFF (WIRE) IMPLANT

## 2022-02-17 NOTE — Op Note (Signed)
Patient name: Andrew Kaiser MRN: [id number] DOB: [birth year] Sex: male  02/17/2022 Pre-operative Diagnosis: Left lower extremity iliofemoral deep venous thrombosis Post-operative diagnosis:  Same Surgeon:  [name], MD Procedure Performed: 1.  Guided micropuncture access of the left popliteal vein 2.  Iliocaval venogram, left lower extremity venogram 3.  Percutaneous mechanical thrombectomy using Inari clot Treiver 4.  Intravascular ultrasound (IVUS) 5.  Balloon venoplasty of the left common iliac vein, external iliac vein, common femoral vein 12 x 60 mm 6.  Moderate sedation time 62 minutes 7.  Contrast volume 70 mL   Indications: Patient is a 61 year old male who has appreciated left lower extremity swelling over the last 2 weeks.  He presented to the hospital last week with imaging demonstrating iliofemoral DVT.  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  We discussed the risk and benefits of conservative therapy versus percutaneous mechanical thrombectomy.  Being that dog was having difficulty with ambulation due to pain, he elected to proceed with percutaneous mechanical thrombectomy.  Findings:  Diffuse thrombus extending from the left popliteal vein through the left common iliac vein.   The inferior vena cava was not involved.   Procedure:  The patient was identified in the holding area and taken to room 8.  The patient was then placed prone on the table and prepped and draped in the usual sterile fashion.  A time out was called.  Ultrasound was used to evaluate the left popliteal vein.  It was dilated and thrombus was appreciated.  A digital ultrasound image was acquired.  A micropuncture needle was used to access the vein.  A wire was then run into the right subclavian vein.  Venography followed.  See results above.  I elected to intervene on the diffuse thrombus appreciated in the left leg.  The  Inari clot Treiver device was opened and prepped in standard fashion.  It was then run to the level of the iliac crest which was the distal portion of the inferior vena cava, and 7 passes in all occurred in clockwise fashion.  Follow-up angiography demonstrated significant improvement with recanalization of the left deep venous system.  There appeared to be some residual stenosis at the level of the common femoral vein.  I used an IVUS to assess the left common iliac vein.  There was compression demonstrated, however this did not appear flow-limiting during venography.  I elected to use a 12 x 60 mm balloon to venoplasty of the left common iliac vein through the common femoral vein.  Follow-up angiography demonstrated significant improvement with less than 30% residual stenosis at the level of the common femoral artery.   At this point, I terminated the case.  The access site was closed with the use of 4-0 Monocryl suture.  Prior to the operation, I discussion with Andrew Kaiser that I would only place a stent if there was significant, flow-limiting stenosis.  He does not fit the usual patient profile associated with May Thurner syndrome.  I sent thrombus to pathology to ensure there is no occult malignancy.  He is aware, that should he have another deep venous thrombosis, he would require iliac stenting.    At this time, we will continue with anticoagulation.  As of now, this is classified as an unprovoked DVT.  I will have him see hematology for hypercoagulable work-up.  He does not have family staying at his home, will therefore stay the  night for observation.    Andrew Santee, MD [facility] [place] Office: [phone]

## 2022-02-17 NOTE — H&P (Signed)
Hospital Consult  Patient seen and examined in preop holding.  No complaints. No changes to medication history or physical exam since last seen in clinic. After discussing the risks and benefits of left leg venous thrombectomy, Andrew Kaiser elected to proceed.   [name] MD   Reason for Consult: Left lower extremity extensive DVT Requesting Physician: ED MRN #:  [id number]  History of Present Illness: This is a 61 y.o. male with history outlined below who presented to the hospital with a 2-week history of left lower extremity pain.  This pain was described as constant, worsening with ambulation.  He is also appreciated significant swelling throughout the entirety of the left lower extremity.  Andrew Kaiser denies recent immobility, travel, history of hypercoagulability, family history of blood clots.  No prior history of DVT.  Vascular surgery was called for management.  On exam, Andrew Kaiser was comfortable, no complaints.  Stated he was able to ambulate, however this was with pain.  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 Authorizing Provider  hydrOXYzine (ATARAX) 25 MG tablet Take 25-50 mg by mouth See admin instructions. Take '25mg'$  by mouth three times a day. Take '50mg'$  daily at bedtime as needed for sleep or anxiety. 12/31/21  Yes [provider]  mirtazapine (REMERON) 15 MG tablet TAKE 1 TABLET BY MOUTH AT BEDTIME Patient taking differently: Take 15 mg by mouth at bedtime. 04/03/20 02/13/22 Yes   Oxcarbazepine (TRILEPTAL) 300 MG tablet Take 300 mg by mouth 2  (two) times daily.   Yes [provider]  risperiDONE (RISPERDAL) 2 MG tablet Take 2 mg by mouth at bedtime. 12/31/21  Yes [provider]  risperiDONE (RISPERDAL) 0.5 MG tablet TAKE 1 TABLET BY MOUTH DAILY AT BEDTIME. Patient not taking: Reported on 02/13/2022 04/03/20 02/13/22      Social History   Socioeconomic History   Marital status: Divorced    Spouse name: Not on file   Number of children: 1   Years of education: 15   Highest education level: Not on file  Occupational History   Occupation: unemployed  Tobacco Use   Smoking status: Former    Types: Cigars   Smokeless tobacco: Never  Vaping Use   Vaping Use: Never used  Substance and Sexual Activity   Alcohol use: Yes    Alcohol/week: 0.0 standard drinks of alcohol    Comment: socially   Drug use: No   Sexual activity: Not on file  Other Topics Concern   Not on file  Social History Narrative   Patient drinks caffeine occasionally.   Patient is left handed.   Social Determinants of Health   Financial Resource Strain: High Risk (11/24/2021)   Overall Financial Resource Strain (CARDIA)    Difficulty of Paying Living Expenses: Hard  Food Insecurity: No Food Insecurity (11/24/2021)   Hunger Vital Sign    Worried About Running Out of Food in the  Last Year: Never true    Wineglass in the Last Year: Never true  Transportation Needs: Unmet Transportation Needs (11/24/2021)   PRAPARE - Transportation    Lack of Transportation (Medical): Yes    Lack of Transportation (Non-Medical): Yes  Physical Activity: Sufficiently Active (11/24/2021)   Exercise Vital Sign    Days of Exercise per Week: 7 days    Minutes of Exercise per Session: 30 min  Stress: Stress Concern Present (11/24/2021)   [place]    Feeling of Stress : To some extent  Social Connections: Socially Isolated (11/24/2021)   Social Connection and Isolation Panel [NHANES]     Frequency of Communication with Friends and Family: More than three times a week    Frequency of Social Gatherings with Friends and Family: More than three times a week    Attends Religious Services: Never    Marine scientist or Organizations: No    Attends Archivist Meetings: Never    Marital Status: Divorced  Human resources officer Violence: Not At Risk (11/24/2021)   Humiliation, Afraid, Rape, and Kick questionnaire    Fear of Current or Ex-Partner: No    Emotionally Abused: No    Physically Abused: No    Sexually Abused: No   Family History  Problem Relation Age of Onset   Stroke Mother    Heart disease Father    Asthma Father    Stroke Sister    Multiple sclerosis Sister    Colon cancer Neg Hx    Pancreatic cancer Neg Hx    Rectal cancer Neg Hx    Stomach cancer Neg Hx    Esophageal cancer Neg Hx    Prostate cancer Neg Hx    Colon polyps Neg Hx     ROS: Otherwise negative unless mentioned in HPI  Physical Examination  Vitals:   02/17/22 1445 02/17/22 1538  BP: (!) 141/75 134/81  Pulse: 91 87  Resp: 14 16  Temp:  98.3 F (36.8 C)  SpO2: 100% 100%   Body mass index is 26.45 kg/m.  General:  WDWN in NAD Gait: Not observed HENT: WNL, normocephalic Pulmonary: normal non-labored breathing, without Rales, rhonchi,  wheezing Cardiac: regular Abdomen: soft, NT/ND, no masses Skin: without rashes Vascular Exam/Pulses: 2+ DP bilaterally Extremities: without ischemic changes, without Gangrene , without cellulitis; without open wounds;  Musculoskeletal: no muscle wasting or atrophy  Neurologic: A&O X 3;  No focal weakness or paresthesias are detected; speech is fluent/normal Psychiatric:  The pt has Normal affect. Lymph:  Unremarkable  CBC    Component Value Date/Time   WBC 9.8 02/14/2022 0456   RBC 4.02 (L) 02/14/2022 0456   HGB 11.6 (L) 02/17/2022 1058   HGB 14.2 06/06/2019 1152   HCT 34.0 (L) 02/17/2022 1058   HCT 42.4 06/06/2019 1152   PLT 207  02/14/2022 0456   PLT 214 06/06/2019 1152   MCV 86.8 02/14/2022 0456   MCV 88 06/06/2019 1152   MCH 29.6 02/14/2022 0456   MCHC 34.1 02/14/2022 0456   RDW 12.3 02/14/2022 0456   RDW 12.9 06/06/2019 1152   LYMPHSABS 0.9 02/13/2022 0149   LYMPHSABS 1.5 06/06/2019 1152   MONOABS 1.0 02/13/2022 0149   EOSABS 0.0 02/13/2022 0149   EOSABS 0.2 06/06/2019 1152   BASOSABS 0.0 02/13/2022 0149   BASOSABS 0.0 06/06/2019 1152    BMET    Component Value Date/Time   NA 135 02/17/2022 1058  NA 143 06/06/2019 1152   K 3.5 02/17/2022 1058   CL 99 02/17/2022 1058   CO2 26 02/14/2022 0456   GLUCOSE 106 (H) 02/17/2022 1058   BUN 15 02/17/2022 1058   BUN 23 06/06/2019 1152   CREATININE 1.00 02/17/2022 1058   CALCIUM 8.9 02/14/2022 0456   GFRNONAA >60 02/14/2022 0456   GFRAA 79 06/06/2019 1152    COAGS: No results found for: "INR", "PROTIME"    ASSESSMENT/PLAN: This is a 61 y.o. male presenting with a 2-week history of left lower extremity swelling and pain with ambulation.  Imaging demonstrates extensive left lower extremity DVT extending from the common iliac vein into the tibial veins.  On physical exam, he had a palpable pulse in the foot, no concern for phlegmasia.  There is some asymmetric swelling as compared to the right.  I had a long conversation with Andrew Kaiser.  We discussed medical management versus percutaneous thrombectomy with possible stenting for May Thurner.  After discussing the risk and benefits of both, Andrew Kaiser elected to proceed with percutaneous thrombectomy.  Unfortunately, the service line does not have availability until Wednesday.  I will discuss this with Andrew Kaiser shortly, and will have him present as an outpatient.  He can be transition to a DOAC, and continue that [place] to the time of his intervention.  I will have my office call Andrew Kaiser and set up his outpatient procedure.  Pt felt comfortable with proceeding with the plan above.   [name] MD [facility] [phone] 02/17/2022  3:50 PM

## 2022-02-18 ENCOUNTER — Ambulatory Visit (INDEPENDENT_AMBULATORY_CARE_PROVIDER_SITE_OTHER): Payer: Medicaid Other | Admitting: Mental Health

## 2022-02-18 ENCOUNTER — Other Ambulatory Visit (HOSPITAL_COMMUNITY): Payer: Self-pay

## 2022-02-18 ENCOUNTER — Encounter (HOSPITAL_COMMUNITY): Payer: Self-pay | Admitting: Vascular Surgery

## 2022-02-18 DIAGNOSIS — F3113 Bipolar disorder, current episode manic without psychotic features, severe: Secondary | ICD-10-CM | POA: Diagnosis not present

## 2022-02-18 DIAGNOSIS — I82412 Acute embolism and thrombosis of left femoral vein: Secondary | ICD-10-CM | POA: Diagnosis not present

## 2022-02-18 DIAGNOSIS — F3130 Bipolar disorder, current episode depressed, mild or moderate severity, unspecified: Secondary | ICD-10-CM

## 2022-02-18 LAB — SURGICAL PATHOLOGY

## 2022-02-18 MED ORDER — INFLUENZA VAC SPLIT QUAD 0.5 ML IM SUSY
0.5000 mL | PREFILLED_SYRINGE | INTRAMUSCULAR | Status: AC | PRN
  Administered 2022-02-18: 0.5 mL via INTRAMUSCULAR
  Filled 2022-02-18: qty 0.5

## 2022-02-18 MED ORDER — APIXABAN 5 MG PO TABS
10.0000 mg | ORAL_TABLET | Freq: Two times a day (BID) | ORAL | 0 refills | Status: DC
  Filled 2022-02-18: qty 16, 4d supply, fill #0

## 2022-02-18 MED ORDER — APIXABAN 5 MG PO TABS: 5.0000 mg | ORAL_TABLET | Freq: Two times a day (BID) | ORAL | Status: DC

## 2022-02-18 MED FILL — Lidocaine HCl Local Preservative Free (PF) Inj 1%: INTRAMUSCULAR | Qty: 30 | Status: AC

## 2022-02-18 MED FILL — Heparin Sod (Porcine)-NaCl IV Soln 1000 Unit/500ML-0.9%: INTRAVENOUS | Qty: 500 | Status: AC

## 2022-02-18 NOTE — Progress Notes (Signed)
[facility] [place]  Subjective  - Leg feel less tight.   Objective 124/61 (!) 102 98.1 F (36.7 C) (Oral) 18 100%  Intake/Output Summary (Last 24 hours) at 02/18/2022 0928 Last data filed at 02/18/2022 0807 Gross per 24 hour  Intake 240 ml  Output 600 ml  Net -360 ml    Dressing removed.  Edema in the left LE with soft compartments. Left LE post knee stick site without hematoma Left LE N/V/M intact Lungs non labored breathing  Assessment/Planning: POD # 1  Percutaneous mechanical thrombectom and Balloon venoplasty of the left common iliac vein, external iliac vein, common femoral vein  Ordered thigh high compression to be worn daily ASA and Xarelto daily elevation when at rest and referral to Hematology F/U in 1 month for ivc left LE venous duplex.  Roxy Horseman 02/18/2022 9:28 AM --  Laboratory Lab Results: Recent Labs    02/17/22 1058 02/17/22 1616  WBC  --  10.0  HGB 11.6* 10.5*  HCT 34.0* 30.8*  PLT  --  332   BMET Recent Labs    02/17/22 1058  NA 135  K 3.5  CL 99  GLUCOSE 106*  BUN 15  CREATININE 1.00    COAG No results found for: "INR", "PROTIME" No results found for: "PTT"

## 2022-02-18 NOTE — Progress Notes (Signed)
Thigh high compression stocking applied to left leg. Patient provided education on how to don the sock and that he should wear the sock during the day and remove before going to bed. Additionally patient is aware he should elevate his leg whenever possible to help with swelling.

## 2022-02-18 NOTE — Progress Notes (Signed)
THERAPIST PROGRESS NOTE Virtual Visit via Video Note  I connected with Andrew Kaiser on 02/19/22 at  3:30 PM EST by a video enabled telemedicine application and verified that I am speaking with the correct person using two identifiers.  Location: Patient: home address on file  Provider: office   I discussed the limitations of evaluation and management by telemedicine and the availability of in person appointments. The patient expressed understanding and agreed to proceed.  I discussed the assessment and treatment plan with the patient. The patient was provided an opportunity to ask questions and all were answered. The patient agreed with the plan and demonstrated an understanding of the instructions.   The patient was advised to call back or seek an in-person evaluation if the symptoms worsen or if the condition fails to improve as anticipated.  I provided 35 minutes of non-face-to-face time during this encounter.   Marion Downer, [facility]   Session Time: 3:31pm ( 35 minutes)  Participation Level: Active  Behavioral Response: DisheveledAlertDysphoric  Type of Therapy: Individual Therapy  Treatment Goals addressed: STG: Jessie "Andrew Kaiser" will increase stability in moods AEB development of x 3 effective coping skills with ability to develop daily routine within the next 6 moths   STG: [employer]" increase stability in moods AEB daily medication compliance within the next 6 months   ProgressTowards Goals: Initial  Interventions: Supportive  Summary: Andrew Kaiser is a 61 y.o. male who presents with Bipolar disorder current episode depressed. Andrew Kaiser presents alert and oriented; mood and affect low; depressed. Speech clear and coherent at normal rate and tone. Groomed in dishelved manner with typical grooming to be neat; clean. Andrew Kaiser shares recently being admitted to ED for presence of blood clots and shares thoughts of not being abel to do things in which he used to do and  reports high degree of pain when walking. Shares for sister to have offered for him to come reside with her but shares hesitation in doing so. Shares to be eating ok; difficulty with sleep. Reports to have medications. Shares stressor of finances, unemployed and car to still not be operational. 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 Explored with therapist procs and cons of remaining living by himself vs. Living with sister. Denies safety concerns; denies SI/HI.  Andrew Kaiser reports medication compliance and awaiting for new medications to be delivered.  Decline in mood; depression noticeable with decline in hygiene.    Suicidal/Homicidal: Nowithout intent/plan  Therapist Response: Therapist engaged Andrew Kaiser in [employer]. Confirmed confidential space. Reviewed previous session. Assessed for level of functioning, sxs management; level of stressors and sxs management. Explored with [facility] therapist concern for level of functioning and sxs of depression with hx of not attending to self-care during episodes of depression. Assessed for manic and depressive sxs and safety. Supported Surveyor, minerals in processing thoughts of getting older and presence of blood clots running in family. Supported Surveyor, minerals in exploring option to live with sister and supported in Orthoptist. Encouraged Andrew Kaiser to remain medication compliant and keep in communication with providers concerning sxs. Explored for use of coping skills and engagement in enjoyable activities and social supports. Reviewed session and provided follow up.   Plan: Return again in  (x 4 week due to holiday) weeks.  Diagnosis: Bipolar disorder, current episode manic without psychotic features, severe (HCC)  Bipolar I disorder, most recent episode depressed ([place])  Collaboration of  Care: Other None  Patient/Guardian was advised Release of Information must be obtained prior to any record  release in order to collaborate their care with an outside provider. Patient/Guardian was advised if they have not already done so to contact the registration department to sign all necessary forms in order for [place] to release information regarding their care.   Consent: Patient/Guardian gives verbal consent for treatment and assignment of benefits for services provided during this visit. Patient/Guardian expressed understanding and agreed to proceed.   Rockey Situ [place], [facility] 02/19/2022

## 2022-02-18 NOTE — Progress Notes (Signed)
  Daily Progress Note  S/p:left leg mechanical thrombectomy  Subjective: NAEO. Leg feels better today  Objective: Vitals:   02/18/22 0426 02/18/22 0802  BP: 112/78 124/61  Pulse: 84 (!) 102  Resp: 18 18  Temp: 98.5 F (36.9 C) 98.1 F (36.7 C)  SpO2: 99% 100%    Physical Examination Less LLE swelling Palpable pulses   ASSESSMENT/PLAN:  S/p left leg mechanical thrombectomy for DVT. Doing well. Home today One month follow up with left leg venous duplex. Pt aware he must continue his anticoagulation.  Will get hematology outpt referral    [name] MD [facility] [phone] 02/18/2022  9:27 AM

## 2022-02-19 DIAGNOSIS — F3130 Bipolar disorder, current episode depressed, mild or moderate severity, unspecified: Secondary | ICD-10-CM | POA: Insufficient documentation

## 2022-02-19 NOTE — Discharge Summary (Signed)
[facility] Discharge Summary   Patient ID:  Andrew Kaiser MRN: [id number] DOB/AGE: October 27, 1960 61 y.o.  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 He presented to the hospital last week with imaging demonstrating iliofemoral DVT. 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    Hospital Course:  Andrew Kaiser is a 61 y.o. male is S/P  Procedure(s): VENOUS THROMBECTOMY PERIPHERAL VASCULAR BALLOON ANGIOPLASTY No issues over night Wrap removed from leg, decreased edema per patients report. 20-30 mm hg thigh compression placed prior to discharged.  ASA and Xarelto daily elevation when at rest and referral to Hematology F/U in 1 month for ivc left LE venous duplex.  Discharged home in stable condition   Significant Diagnostic Studies: CBC Lab Results  Component Value Date   WBC 10.0 02/17/2022   HGB 10.5 (L) 02/17/2022   HCT 30.8 (L) 02/17/2022   MCV 85.6 02/17/2022   PLT 332 02/17/2022    BMET    Component Value Date/Time   NA 135 02/17/2022 1058   NA 143 06/06/2019 1152   K 3.5 02/17/2022 1058   CL 99 02/17/2022 1058    CO2 26 02/14/2022 0456   GLUCOSE 106 (H) 02/17/2022 1058   BUN 15 02/17/2022 1058   BUN 23 06/06/2019 1152   CREATININE 1.00 02/17/2022 1058   CALCIUM 8.9 02/14/2022 0456   GFRNONAA >60 02/14/2022 0456   GFRAA 79 06/06/2019 1152   COAG No results found for: "INR", "PROTIME"   Disposition:  Discharge to :Home Discharge Instructions     Call MD for:  redness, tenderness, or signs of infection (pain, swelling, bleeding, redness, odor or green/yellow discharge around incision site)   Complete by: As directed    Call MD for:  severe or increased pain, loss or decreased feeling  in affected limb(s)   Complete by: As directed    Call MD for:  temperature >100.5   Complete by: As directed    Discharge instructions   Complete by: As directed    Were compression daily, you may shower daily and remove compression for sleep at night.  Lay down and elevate your legs when at rest.   Resume previous diet   Complete by: As directed       Allergies as of 02/18/2022   No Known Allergies      Medication List     TAKE these medications    apixaban 5 MG Tabs tablet Commonly known as: ELIQUIS Take 2 tablets (10 mg total) by mouth 2 (two) times daily for 4 days.   apixaban 5 MG Tabs tablet Commonly known as: ELIQUIS Take 1 tablet (  5 mg total) by mouth 2 (two) times daily. Start taking on: February 21, 2022 Notes to patient: This lower dose starts December 10th.   hydrOXYzine 25 MG tablet Commonly known as: ATARAX Take 25-50 mg by mouth See admin instructions. Take '25mg'$  by mouth three times a day. Take '50mg'$  daily at bedtime as needed for sleep or anxiety.   mirtazapine 15 MG tablet Commonly known as: REMERON TAKE 1 TABLET BY MOUTH AT BEDTIME What changed: how much to take   Oxcarbazepine 300 MG tablet Commonly known as: TRILEPTAL Take 300 mg by mouth 2 (two) times daily.   risperiDONE 2 MG tablet Commonly known as: RISPERDAL Take 2 mg by mouth at bedtime.   rosuvastatin 10  MG tablet Commonly known as: CRESTOR Take 1 tablet (10 mg total) by mouth daily.       Verbal and written Discharge instructions given to the patient. Wound care per Discharge AVS  Follow-up Information     [name], MD Follow up in 4 week(s).   Specialty: Vascular Surgery Why: Office will call you to arrange your appt (sent) Contact information: [place] [address] [phone]                 Signed: Roxy Horseman 02/19/2022, 2:32 PM

## 2022-03-04 ENCOUNTER — Other Ambulatory Visit: Payer: Self-pay | Admitting: Student

## 2022-03-05 ENCOUNTER — Other Ambulatory Visit: Payer: Self-pay

## 2022-03-05 ENCOUNTER — Other Ambulatory Visit: Payer: Self-pay | Admitting: Student

## 2022-03-05 MED ORDER — APIXABAN 5 MG PO TABS: 5.0000 mg | ORAL_TABLET | Freq: Two times a day (BID) | ORAL | 0 refills | Status: DC

## 2022-03-19 ENCOUNTER — Other Ambulatory Visit: Payer: Self-pay | Admitting: *Deleted

## 2022-03-19 DIAGNOSIS — I824Y2 Acute embolism and thrombosis of unspecified deep veins of left proximal lower extremity: Secondary | ICD-10-CM

## 2022-03-23 ENCOUNTER — Ambulatory Visit (INDEPENDENT_AMBULATORY_CARE_PROVIDER_SITE_OTHER): Payer: Medicaid Other | Admitting: Mental Health

## 2022-03-23 DIAGNOSIS — F3130 Bipolar disorder, current episode depressed, mild or moderate severity, unspecified: Secondary | ICD-10-CM

## 2022-03-23 NOTE — Progress Notes (Unsigned)
   THERAPIST PROGRESS NOTE Virtual Visit via Video Note  I connected with Andrew Kaiser on 03/23/22 at 12:30 PM EST by a video enabled telemedicine application and verified that I am speaking with the correct person using two identifiers.  Location: Patient: home address on file Provider: office    I discussed the limitations of evaluation and management by telemedicine and the availability of in person appointments. The patient expressed understanding and agreed to proceed.  I discussed the assessment and treatment plan with the patient. The patient was provided an opportunity to ask questions and all were answered. The patient agreed with the plan and demonstrated an understanding of the instructions.   The patient was advised to call back or seek an in-person evaluation if the symptoms worsen or if the condition fails to improve as anticipated.  I provided 45 minutes of non-face-to-face time during this encounter.   Marion Downer, [facility]   Session Time: 12:35pm ( 45 minutes)   Participation Level: Active  Behavioral Response: DisheveledAlertDysphoric  Type of Therapy: Individual Therapy  Treatment Goals addressed: STG: [place] "[name]" will increase stability in moods AEB development of x 3 effective coping skills with ability to develop daily routine within the next 6 moths    STG: [employer]" increase stability in moods AEB daily medication compliance within the next 6 months   ProgressTowards Goals: Progressing  Interventions: Supportive  Summary: Andrew Kaiser is a 62 y.o. male who presents with Bipolar disorder current episode depressed. [name] presents alert and oriented; mood and affect low; depressed. Speech clear and coherent at normal rate and tone. Groomed in dishelved manner with typical grooming to be neat;   Suicidal/Homicidal: Nowithout intent/plan  Therapist Response: Therapist engaged [name] in [place] session. Confirmed confidential space.  Reviewed previous session. Assessed for level of functioning, sxs management; level of stressors and sxs management. Explored with [name] therapist concern for level of functioning   Plan: Return again in x 3 weeks.  Diagnosis: Bipolar I disorder, most recent episode depressed ([place])  Collaboration of Care: Other None  Patient/Guardian was advised Release of Information must be obtained prior to any record release in order to collaborate their care with an outside provider. Patient/Guardian was advised if they have not already done so to contact the registration department to sign all necessary forms in order for [place] to release information regarding their care.   Consent: Patient/Guardian gives verbal consent for treatment and assignment of benefits for services provided during this visit. Patient/Guardian expressed understanding and agreed to proceed.   Rockey Situ [place], [facility] 03/23/2022

## 2022-04-02 ENCOUNTER — Ambulatory Visit (INDEPENDENT_AMBULATORY_CARE_PROVIDER_SITE_OTHER): Payer: Medicaid Other | Admitting: Physician Assistant

## 2022-04-02 ENCOUNTER — Encounter: Payer: Self-pay | Admitting: Physician Assistant

## 2022-04-02 ENCOUNTER — Telehealth: Payer: Self-pay

## 2022-04-02 ENCOUNTER — Ambulatory Visit (INDEPENDENT_AMBULATORY_CARE_PROVIDER_SITE_OTHER)
Admission: RE | Admit: 2022-04-02 | Discharge: 2022-04-02 | Disposition: A | Discharge status: Home / Self Care | Payer: Medicaid Other | Source: Ambulatory Visit | Attending: Vascular Surgery | Admitting: Vascular Surgery

## 2022-04-02 ENCOUNTER — Ambulatory Visit (HOSPITAL_COMMUNITY)
Admission: RE | Admit: 2022-04-02 | Discharge: 2022-04-02 | Disposition: A | Discharge status: Home / Self Care | Payer: Medicaid Other | Source: Ambulatory Visit | Attending: Vascular Surgery | Admitting: Vascular Surgery

## 2022-04-02 VITALS — BP 114/76 | HR 82 | Temp 98.2°F | Resp 20 | Ht 72.0 in | Wt 202.8 lb

## 2022-04-02 DIAGNOSIS — I824Y2 Acute embolism and thrombosis of unspecified deep veins of left proximal lower extremity: Secondary | ICD-10-CM

## 2022-04-02 NOTE — Telephone Encounter (Signed)
Called pt to schedule procedure. LVM asking him to call us back.

## 2022-04-02 NOTE — Progress Notes (Signed)
POST OPERATIVE OFFICE NOTE    CC:  F/u for surgery  HPI:  This is a 62 y.o. male who is s/p Iliocaval venogram, left lower extremity venogram; Percutaneous mechanical thrombectomy using Inari clot Treiver; Intravascular ultrasound (IVUS) and Balloon venoplasty of the left common iliac vein, external iliac vein, common femoral vein 12 x 60 mm on 02/17/22 by Dr. Stanford Breed.  He tolerated procedure well and was discharged POD#1 on Aspirin and Eliquis  Pt returns today for follow up.  Pt states he has not seen Hematology. He also says that he did miss 2-3 days of his Eliquis when he had a miss understanding at pharmacy about his refills. He does report that his left leg is feeling better overall. He denies any pain. He does continue to have significant swelling. He is currently wearing compression and does elevate a little.   No Known Allergies  Current Outpatient Medications  Medication Sig Dispense Refill   apixaban (ELIQUIS) 5 MG TABS tablet Take 1 tablet (5 mg total) by mouth 2 (two) times daily. 180 tablet 0   hydrOXYzine (ATARAX) 25 MG tablet Take 25-50 mg by mouth See admin instructions. Take '25mg'$  by mouth three times a day. Take '50mg'$  daily at bedtime as needed for sleep or anxiety.     Oxcarbazepine (TRILEPTAL) 300 MG tablet Take 300 mg by mouth 2 (two) times daily.     risperiDONE (RISPERDAL) 2 MG tablet Take 2 mg by mouth at bedtime.     rosuvastatin (CRESTOR) 10 MG tablet Take 1 tablet (10 mg total) by mouth daily. 30 tablet 0   mirtazapine (REMERON) 15 MG tablet TAKE 1 TABLET BY MOUTH AT BEDTIME (Patient taking differently: Take 15 mg by mouth at bedtime.) 30 tablet 2   No current facility-administered medications for this visit.     ROS:  See HPI  Physical Exam:  Vitals:   04/02/22 0935  BP: 114/76  Pulse: 82  Resp: 20  Temp: 98.2 F (36.8 C)  SpO2: 99%   General: well appearing, in no acute distress Cardiac: regular Lungs: non labored Extremities:  BLE well perfused and  warm with palpable DP and PT pulses. Left leg is very edematous throughout. Small pinpoint area of weeping on left anterior leg. No erythema. No tenderness Neuro: alert and oriented  Non invasive vascular lab: VAS [place] IVC/Iliac: Summary:  IVC/Iliac: Visualization of Inferior Vena Cava and proximal left common iliac vein common Iliac was limited. Occlusive acute thrombus observed within the common iliac vein and  external iliac vein.   VAS [place] lower extremity Venous: Summary:  LEFT:  - Findings consistent with acute deep vein thrombosis involving the left common femoral vein, SF junction, left femoral vein, left popliteal vein, and left gastrocnemius veins.   Assessment/Plan:  This is a 62 y.o. male who is s/p: s/p Iliocaval venogram, left lower extremity venogram; Percutaneous mechanical thrombectomy using Inari clot Treiver; Intravascular ultrasound (IVUS) and Balloon venoplasty of the left common iliac vein, external iliac vein, common femoral vein 12 x 60 mm on 02/17/22 by Dr. Stanford Breed. Stent was not used at the time and previous discussion by Dr. Virl Cagey with patient was that if there was recurrence of DVT that it could then be managed with stenting or with anticoagulation alone. Presently patient has improved symptoms from initial presentation. He does continue to have significant swelling in left leg. No signs of phlegmasia. He does wear compression and elevate sometimes.  - unfortunately duplex shows recurrence of left common iliac  and external iliac vein DVT as well as in left common femoral, SFJ, left femoral, popliteal and gastrocnemius veins - I have discussed with him importance of taking his Eliquis daily - encourage him to continue compression and elevation - Re discussed with him re peat percutaneous mechanical thrombectomy with iliac veins stenting vs anticoagulation alone. Discussed risk vs benefits and he would ultimately like to proceed with intervention - I will arrange this in the  near future with Dr. Virl Cagey.    Paulo Fruit, [facility] [phone]   Clinic MD:  Virl Cagey

## 2022-04-04 ENCOUNTER — Emergency Department (HOSPITAL_COMMUNITY)
Admission: EM | Admit: 2022-04-04 | Discharge: 2022-04-05 | Disposition: A | Discharge status: Home / Self Care | Payer: Medicaid Other | Attending: Emergency Medicine | Admitting: Emergency Medicine

## 2022-04-04 ENCOUNTER — Other Ambulatory Visit: Payer: Self-pay

## 2022-04-04 ENCOUNTER — Emergency Department (HOSPITAL_COMMUNITY): Payer: Medicaid Other

## 2022-04-04 ENCOUNTER — Encounter (HOSPITAL_COMMUNITY): Payer: Self-pay | Admitting: Pharmacy Technician

## 2022-04-04 DIAGNOSIS — R2243 Localized swelling, mass and lump, lower limb, bilateral: Secondary | ICD-10-CM | POA: Insufficient documentation

## 2022-04-04 DIAGNOSIS — Z7901 Long term (current) use of anticoagulants: Secondary | ICD-10-CM | POA: Diagnosis not present

## 2022-04-04 DIAGNOSIS — R079 Chest pain, unspecified: Secondary | ICD-10-CM | POA: Insufficient documentation

## 2022-04-04 DIAGNOSIS — R0602 Shortness of breath: Secondary | ICD-10-CM | POA: Insufficient documentation

## 2022-04-04 LAB — BASIC METABOLIC PANEL
Anion gap: 7 (ref 5–15)
BUN: 8 mg/dL (ref 6–20)
CO2: 26 mmol/L (ref 22–32)
Calcium: 8.7 mg/dL — ABNORMAL LOW (ref 8.9–10.3)
Chloride: 105 mmol/L (ref 98–111)
Creatinine, Ser: 1.15 mg/dL (ref 0.61–1.24)
GFR, Estimated: >60 mL/min (ref >60)
Glucose, Bld: 103 mg/dL — ABNORMAL HIGH (ref 70–99)
Potassium: 4.2 mmol/L (ref 3.5–5.1)
Sodium: 138 mmol/L (ref 135–145)

## 2022-04-04 LAB — CBC WITH DIFFERENTIAL/PLATELET
Abs Immature Granulocytes: 0.01 10*3/uL (ref 0.00–0.07)
Basophils Absolute: 0 10*3/uL (ref 0.0–0.1)
Basophils Relative: 0 %
Eosinophils Absolute: 0.5 10*3/uL (ref 0.0–0.5)
Eosinophils Relative: 10 %
HCT: 35.1 % — ABNORMAL LOW (ref 39.0–52.0)
Hemoglobin: 11 g/dL — ABNORMAL LOW (ref 13.0–17.0)
Immature Granulocytes: 0 %
Lymphocytes Relative: 24 %
Lymphs Abs: 1.3 10*3/uL (ref 0.7–4.0)
MCH: 28.9 pg (ref 26.0–34.0)
MCHC: 31.3 g/dL (ref 30.0–36.0)
MCV: 92.1 fL (ref 80.0–100.0)
Monocytes Absolute: 0.4 10*3/uL (ref 0.1–1.0)
Monocytes Relative: 8 %
Neutro Abs: 3 10*3/uL (ref 1.7–7.7)
Neutrophils Relative %: 58 %
Platelets: 239 10*3/uL (ref 150–400)
RBC: 3.81 MIL/uL — ABNORMAL LOW (ref 4.22–5.81)
RDW: 14.4 % (ref 11.5–15.5)
WBC: 5.3 10*3/uL (ref 4.0–10.5)
nRBC: 0 % (ref 0.0–0.2)

## 2022-04-04 LAB — BRAIN NATRIURETIC PEPTIDE: B Natriuretic Peptide: 65.5 pg/mL (ref 0.0–100.0)

## 2022-04-04 LAB — TROPONIN I (HIGH SENSITIVITY): Troponin I (High Sensitivity): 3 ng/L (ref <18)

## 2022-04-04 MED ORDER — ACETAMINOPHEN 325 MG PO TABS
650.0000 mg | ORAL_TABLET | Freq: Once | ORAL | Status: AC
  Administered 2022-04-04: 650 mg via ORAL
  Filled 2022-04-04: qty 2

## 2022-04-04 MED ORDER — IOHEXOL 350 MG/ML SOLN
75.0000 mL | Freq: Once | INTRAVENOUS | Status: AC | PRN
  Administered 2022-04-04: 75 mL via INTRAVENOUS

## 2022-04-04 MED ORDER — FENTANYL CITRATE PF 50 MCG/ML IJ SOSY
50.0000 ug | PREFILLED_SYRINGE | Freq: Once | INTRAMUSCULAR | Status: DC
  Filled 2022-04-04: qty 1

## 2022-04-04 NOTE — ED Provider Notes (Signed)
[facility] Provider Note   CSN: [id number] Arrival date & time: 04/04/22  1802     History  Chief Complaint  Patient presents with   Leg Swelling    Andrew Kaiser is a 62 y.o. male with PMH of previous CVA, bipolar, with recent acute DVT of left lower extremity who presents with concern for shortness of breath, chest pain.  Patient is currently taking Eliquis, he also endorses some ongoing left lower extremity swelling after his recent DVT, he has been wearing compression stockings but reports that it is worsening nonetheless.  He reports that his chest pain and shortness of breath seem to be worse with exertion.  HPI     Home Medications Prior to Admission medications   Medication Sig Start Date End Date Taking? Authorizing Provider  apixaban (ELIQUIS) 5 MG TABS tablet Take 1 tablet (5 mg total) by mouth 2 (two) times daily. 03/05/22   [name], MD  hydrOXYzine (ATARAX) 25 MG tablet Take 25-50 mg by mouth See admin instructions. Take '25mg'$  by mouth three times a day. Take '50mg'$  daily at bedtime as needed for sleep or anxiety. 12/31/21   [provider]  mirtazapine (REMERON) 15 MG tablet TAKE 1 TABLET BY MOUTH AT BEDTIME Patient taking differently: Take 15 mg by mouth at bedtime. 04/03/20 02/13/22    Oxcarbazepine (TRILEPTAL) 300 MG tablet Take 300 mg by mouth 2 (two) times daily.    [provider]  risperiDONE (RISPERDAL) 2 MG tablet Take 2 mg by mouth at bedtime. 12/31/21   [provider]  rosuvastatin (CRESTOR) 10 MG tablet Take 1 tablet (10 mg total) by mouth daily. 02/15/22   Wells Guiles, DO      Allergies    Patient has no known allergies.    Review of Systems   Review of Systems  All other systems reviewed and are negative.   Physical Exam Updated Vital Signs BP 112/69   Pulse 67   Temp 97.9 F (36.6 C) (Oral)   Resp 13   SpO2 96%  Physical Exam Vitals and nursing note  reviewed.  Constitutional:      General: He is not in acute distress.    Appearance: Normal appearance.  HENT:     Head: Normocephalic and atraumatic.  Eyes:     General:        Right eye: No discharge.        Left eye: No discharge.  Cardiovascular:     Rate and Rhythm: Normal rate and regular rhythm.     Heart sounds: No murmur heard.    No friction rub. No gallop.  Pulmonary:     Effort: Pulmonary effort is normal.     Breath sounds: Normal breath sounds.     Comments: Normal respiratory effort, no respiratory distress, no adventitious lung sounds. Abdominal:     General: Bowel sounds are normal.     Palpations: Abdomen is soft.  Musculoskeletal:     Comments: Patient with bilateral lower extremity edema, left greater than right, he has some very small surface level ulcers without any signs of overt cellulitis.  He has intact DP, PT pulses bilaterally 2+.  Skin:    General: Skin is warm and dry.     Capillary Refill: Capillary refill takes less than 2 seconds.  Neurological:     Mental Status: He is alert and oriented to person, place, and time.  Psychiatric:  Mood and Affect: Mood normal.        Behavior: Behavior normal.     ED Results / Procedures / Treatments   Labs (all labs ordered are listed, but only abnormal results are displayed) Labs Reviewed  BASIC METABOLIC PANEL - Abnormal; Notable for the following components:      Result Value   Glucose, Bld 103 (*)    Calcium 8.7 (*)    All other components within normal limits  CBC WITH DIFFERENTIAL/PLATELET - Abnormal; Notable for the following components:   RBC 3.81 (*)    Hemoglobin 11.0 (*)    HCT 35.1 (*)    All other components within normal limits  BRAIN NATRIURETIC PEPTIDE  TROPONIN I (HIGH SENSITIVITY)  TROPONIN I (HIGH SENSITIVITY)    EKG EKG Interpretation  Date/Time:  Sunday April 04 2022 18:54:24 EST Ventricular Rate:  72 PR Interval:  142 QRS Duration: 90 QT Interval:  392 QTC  Calculation: 429 R Axis:   -4 Text Interpretation: Normal sinus rhythm Cannot rule out Anterior infarct , age undetermined Abnormal ECG When compared with ECG of 02-Nov-2021 15:18, nonspecific t waves more pronounced now Confirmed by Aletta Edouard [phone]) on 04/04/2022 9:57:10 PM  Radiology CT Angio Chest PE W/Cm &/Or Wo Cm  Result Date: 04/04/2022 CLINICAL DATA:  Suspicion for pulmonary embolism, history of recent DVT. EXAM: CT ANGIOGRAPHY CHEST WITH CONTRAST TECHNIQUE: Multidetector CT imaging of the chest was performed using the standard protocol during bolus administration of intravenous contrast. Multiplanar CT image reconstructions and MIPs were obtained to evaluate the vascular anatomy. RADIATION DOSE REDUCTION: This exam was performed according to the departmental dose-optimization program which includes automated exposure control, adjustment of the mA and/or kV according to patient size and/or use of iterative reconstruction technique. CONTRAST:  54m OMNIPAQUE IOHEXOL 350 MG/ML SOLN COMPARISON:  02/13/2022. FINDINGS: Cardiovascular: The heart is normal in size and there is no pericardial effusion. An atrial septal occlusion device is noted the aorta and pulmonary trunk are normal in caliber. Small segmental and subsegmental pulmonary emboli are present in the lower lobes bilaterally and likely residual embolus from the prior exam. The right ventricle is mildly distended with a RV/LV ratio 0.99. No interventricular septal bowing. Mediastinum/Nodes: No mediastinal, hilar, or axillary lymphadenopathy. The thyroid gland, trachea, and esophagus are within normal limits. Lungs/Pleura: A small amount of debris is noted in the right mainstem bronchus. Bronchial wall thickening is noted bilaterally. There is atelectasis at the lung bases. No effusion or pneumothorax. Upper Abdomen: No acute abnormality. Musculoskeletal: Degenerative changes in the thoracic spine. No acute osseous abnormality. Review of the  MIP images confirms the above findings. IMPRESSION: Mild residual bilateral lower lobe segmental and subsegmental pulmonary emboli, improved in appearance from the prior exam. Electronically Signed   By: LBrett FairyM.D.   On: 04/04/2022 23:53    Procedures Procedures    Medications Ordered in ED Medications  fentaNYL (SUBLIMAZE) injection 50 mcg (50 mcg Intravenous Patient Refused/Not Given 04/04/22 2334)  acetaminophen (TYLENOL) tablet 650 mg (650 mg Oral Given 04/04/22 2323)  iohexol (OMNIPAQUE) 350 MG/ML injection 75 mL (75 mLs Intravenous Contrast Given 04/04/22 2319)    ED Course/ Medical Decision Making/ A&P                             Medical Decision Making Amount and/or Complexity of Data Reviewed Labs: ordered.  Risk OTC drugs. Prescription drug management.   This patient is  a 62 y.o. male  who presents to the ED for concern of left lower leg edema in context of known DVT, with chest pain, shortness of breath.   Differential diagnoses prior to evaluation: The emergent differential diagnosis includes, but is not limited to,  ACS, AAS, PE, Mallory-Weiss, Boerhaave's, Pneumonia, acute bronchitis, asthma or COPD exacerbation, anxiety, MSK pain or traumatic injury to the chest, acid reflux versus other . This is not an exhaustive differential.   Past Medical History / Co-morbidities: Previous history of bipolar, CVA, DVT, PE, with recent DVT in December of this year in the left lower extremity thrombectomy, he is currently taking Eliquis for anticoagulation  Additional history: Chart reviewed. Pertinent results include: Reviewed outpatient vascular surgery notes, recent admission for acute DVT including lab work, imaging from this evaluation on 02/2022  Physical Exam: Physical exam performed. The pertinent findings include: Patient with bilateral lower extremity edema, left greater than right, he has some very shallow ulcerations, no diffuse redness, purulent drainage, or  focal abscess formation concerning for infectious etiology, overall clinically consistent with venous stasis  Lab Tests/Imaging studies: I personally interpreted labs/imaging and the pertinent results include: BMP unremarkable, CBC unremarkable other than mild anemia, hemoglobin 11.  Troponin negative x 1 EKG in context of mostly pleuritic chest pain.  His BNP is normal at 65.5..  Patient pending CT PE study to evaluate for acute PE at this time.  I agree with the radiologist interpretation.  CT PE study shows some remaining embolus from recent evaluation, but no new embolus or other abnormalities noted.  Cardiac monitoring: EKG obtained and interpreted by my attending physician which shows: Normal sinus rhythm, some nonspecific T waves seen on last EKG more pronounced on repeat exam today.  No evidence of STEMI.   Medications: I ordered medication including Tylenol, fentanyl for leg pain, chest pain.  I have reviewed the patients home medicines and have made adjustments as needed.  Overall patient's clinical presentation is consistent with ongoing chest pain, shortness of breath from recent PE, however he has no clinical signs and symptoms to suggest right heart strain, or respiratory distress related to his decreased lung function.  His oxygen saturation is stable on room air, he is not tachycardic, tachypneic, and he is overall well-appearing.  He does have some lingering lower extremity edema which is likely secondary to poor vasculature especially in context of recent blood clot and thrombectomy, recommend continue compression stockings, ambulation, and follow-up with vascular surgery as needed. Final Clinical Impression(s) / ED Diagnoses Final diagnoses:  Chest pain, unspecified type  Shortness of breath    Rx / DC Orders ED Discharge Orders     None         Dorien Chihuahua 04/05/22 0005    Hayden Rasmussen, MD 04/05/22 1042

## 2022-04-04 NOTE — ED Triage Notes (Addendum)
Pt here via ems with reports of LLE "for a while". Hx heart failure. Pt also with wound to top of L foot he would like evaluated. Endorses some intermittent shob and chest pain.

## 2022-04-04 NOTE — ED Provider Triage Note (Signed)
Emergency Medicine Provider Triage Evaluation Note  Andrew Kaiser , a 62 y.o. male  was evaluated in triage.  Pt complains of left lower extremity swelling. History of DVT with thrombectomy 02/17/22. Was seen in follow-up with vascular on Friday. Has not follow-up with hematology yet. Today he reports intermittent chest pain and shortness of breath. Will obtain basic labs, CT A of chest to R/O PE.Marland Kitchen  Review of Systems  Positive: Chest pain, shortness of breath Negative: Fever, chills, cough  Physical Exam  BP 117/67   Pulse 83   Temp 98.4 F (36.9 C)   Resp 18   SpO2 95%  Gen:   Awake, no distress   Resp:  Normal effort  MSK:   Moves extremities without difficulty  Other:  LLE edema, wearing compression stocking  Medical Decision Making  Medically screening exam initiated at 6:34 PM.  Appropriate orders placed.  JERMAYNE SWEENEY was informed that the remainder of the evaluation will be completed by another provider, this initial triage assessment does not replace that evaluation, and the importance of remaining in the ED until their evaluation is complete.     Etta Quill, NP 04/04/22 1857

## 2022-04-05 LAB — TROPONIN I (HIGH SENSITIVITY): Troponin I (High Sensitivity): 3 ng/L (ref <18)

## 2022-04-05 NOTE — Discharge Instructions (Signed)
I suspect that your ongoing chest pain shortness of breath is because of the blood clot you are recently diagnosed with, please continue taking your Eliquis as prescribed and I recommend that you follow-up with your primary care doctor, as well as I have referred you to a vascular surgeon to discuss the swelling in your lower extremities if it does not improve.

## 2022-04-06 ENCOUNTER — Other Ambulatory Visit: Payer: Self-pay

## 2022-04-06 DIAGNOSIS — I824Y2 Acute embolism and thrombosis of unspecified deep veins of left proximal lower extremity: Secondary | ICD-10-CM

## 2022-04-06 NOTE — Telephone Encounter (Signed)
Left voice mail message for patient to return call.

## 2022-04-06 NOTE — Telephone Encounter (Signed)
Patient returned call. Scheduled thrombectomy procedure for 04/14/22. Instructions provided- patient verbalized understanding.

## 2022-04-14 ENCOUNTER — Encounter (HOSPITAL_COMMUNITY)
Admission: RE | Disposition: A | Discharge status: Home / Self Care | Payer: Self-pay | Source: Home / Self Care | Attending: Vascular Surgery

## 2022-04-14 ENCOUNTER — Other Ambulatory Visit: Payer: Self-pay

## 2022-04-14 ENCOUNTER — Ambulatory Visit (HOSPITAL_COMMUNITY)
Admission: RE | Admit: 2022-04-14 | Discharge: 2022-04-14 | Disposition: A | Discharge status: Home / Self Care | Payer: Medicaid Other | Attending: Vascular Surgery | Admitting: Vascular Surgery

## 2022-04-14 DIAGNOSIS — I82422 Acute embolism and thrombosis of left iliac vein: Secondary | ICD-10-CM | POA: Diagnosis not present

## 2022-04-14 DIAGNOSIS — I824Y2 Acute embolism and thrombosis of unspecified deep veins of left proximal lower extremity: Secondary | ICD-10-CM

## 2022-04-14 DIAGNOSIS — I82402 Acute embolism and thrombosis of unspecified deep veins of left lower extremity: Secondary | ICD-10-CM | POA: Diagnosis not present

## 2022-04-14 DIAGNOSIS — Z7901 Long term (current) use of anticoagulants: Secondary | ICD-10-CM | POA: Diagnosis not present

## 2022-04-14 DIAGNOSIS — I82412 Acute embolism and thrombosis of left femoral vein: Secondary | ICD-10-CM | POA: Diagnosis present

## 2022-04-14 HISTORY — PX: LOWER EXTREMITY VENOGRAPHY: CATH118253

## 2022-04-14 HISTORY — PX: IVC VENOGRAPHY: CATH118301

## 2022-04-14 LAB — POCT I-STAT, CHEM 8
BUN: 12 mg/dL (ref 6–20)
Calcium, Ion: 1.18 mmol/L (ref 1.15–1.40)
Chloride: 101 mmol/L (ref 98–111)
Creatinine, Ser: 1 mg/dL (ref 0.61–1.24)
Glucose, Bld: 88 mg/dL (ref 70–99)
HCT: 36 % — ABNORMAL LOW (ref 39.0–52.0)
Hemoglobin: 12.2 g/dL — ABNORMAL LOW (ref 13.0–17.0)
Potassium: 3.8 mmol/L (ref 3.5–5.1)
Sodium: 139 mmol/L (ref 135–145)
TCO2: 26 mmol/L (ref 22–32)

## 2022-04-14 SURGERY — LOWER EXTREMITY VENOGRAPHY
Anesthesia: LOCAL

## 2022-04-14 MED ORDER — SODIUM CHLORIDE 0.9 % IV SOLN: INTRAVENOUS | Status: DC

## 2022-04-14 MED ORDER — HEPARIN SODIUM (PORCINE) 1000 UNIT/ML IJ SOLN
INTRAMUSCULAR | Status: AC
  Filled 2022-04-14: qty 10

## 2022-04-14 MED ORDER — LIDOCAINE HCL (PF) 1 % IJ SOLN
INTRAMUSCULAR | Status: AC
  Filled 2022-04-14: qty 30

## 2022-04-14 MED ORDER — MIDAZOLAM HCL 2 MG/2ML IJ SOLN
INTRAMUSCULAR | Status: AC
  Filled 2022-04-14: qty 2

## 2022-04-14 MED ORDER — HEPARIN (PORCINE) IN NACL 1000-0.9 UT/500ML-% IV SOLN
INTRAVENOUS | Status: AC
  Filled 2022-04-14: qty 500

## 2022-04-14 MED ORDER — MIDAZOLAM HCL 2 MG/2ML IJ SOLN
INTRAMUSCULAR | Status: DC | PRN
  Administered 2022-04-14: 1 mg via INTRAVENOUS

## 2022-04-14 MED ORDER — FENTANYL CITRATE (PF) 100 MCG/2ML IJ SOLN
INTRAMUSCULAR | Status: DC | PRN
  Administered 2022-04-14: 50 ug via INTRAVENOUS

## 2022-04-14 MED ORDER — FENTANYL CITRATE (PF) 100 MCG/2ML IJ SOLN
INTRAMUSCULAR | Status: AC
  Filled 2022-04-14: qty 2

## 2022-04-14 MED ORDER — LIDOCAINE HCL (PF) 1 % IJ SOLN
INTRAMUSCULAR | Status: DC | PRN
  Administered 2022-04-14: 15 mL

## 2022-04-14 MED ORDER — HEPARIN (PORCINE) IN NACL 1000-0.9 UT/500ML-% IV SOLN
INTRAVENOUS | Status: DC | PRN
  Administered 2022-04-14: 500 mL

## 2022-04-14 MED ORDER — IODIXANOL 320 MG/ML IV SOLN
INTRAVENOUS | Status: DC | PRN
  Administered 2022-04-14: 50 mL

## 2022-04-14 SURGICAL SUPPLY — 10 items
CATH NAVICROSS ANGLED 90CM (MICROCATHETER) IMPLANT
CATH VISIONS PV .035 IVUS (CATHETERS) IMPLANT
COVER DOME SNAP 22 D (MISCELLANEOUS) IMPLANT
GLIDEWIRE ADV .035X260CM (WIRE) IMPLANT
KIT MICROPUNCTURE NIT STIFF (SHEATH) IMPLANT
SHEATH PINNACLE 5F 10CM (SHEATH) IMPLANT
SHEATH PINNACLE 8F 10CM (SHEATH) IMPLANT
SHEATH PROBE COVER 6X72 (BAG) IMPLANT
TRAY PV CATH (CUSTOM PROCEDURE TRAY) IMPLANT
WIRE TORQFLEX AUST .018X40CM (WIRE) IMPLANT

## 2022-04-14 NOTE — Discharge Instructions (Signed)
Call Dr Virl Cagey if any problems, questions, or concerns; remove dressing as instructed by Dr Virl Cagey

## 2022-04-14 NOTE — H&P (Signed)
POST OPERATIVE OFFICE NOTE  Patient seen and examined in preop holding.  No complaints. No changes to medication history or physical exam since last seen in clinic. In short, Andrew Kaiser is a 62 year old male with history of left lower extremity DVT status post left mechanical thrombectomy with balloon venoplasty.  At the time, the DVT was defined as, therefore stenosis present at the left common iliac vein was not stented. He was last seen in our office roughly 2 weeks ago.  He has been compliant with Eliquis other than missing 2 to 3 days due to a misunderstanding regarding refills.  Overall, he feels better, denies pain, but continues to have swelling in the left lower extremity.  He is currently wearing compression stockings, but has been less compliant with elevation. Recent imaging demonstrated reocclusion of the left-sided iliac veins, common femoral vein, femoral vein.  After discussing the risk and benefits of repeat left lower extremity thrombectomy with stenting to prevent further embolic events, Andrew Kaiser elected to proceed.Andrew John MD   CC:  F/u for surgery  HPI:  This is a 62 y.o. male who is s/p Iliocaval venogram, left lower extremity venogram; Percutaneous mechanical thrombectomy using Inari clot Treiver; Intravascular ultrasound (IVUS) and Balloon venoplasty of the left common iliac vein, external iliac vein, common femoral vein 12 x 60 mm on 02/17/22 by Dr. Stanford Breed.  He tolerated procedure well and was discharged POD#1 on Aspirin and Eliquis  Pt returns today for follow up.  Pt states he has not seen Hematology. He also says that he did miss 2-3 days of his Eliquis when he had a miss understanding at pharmacy about his refills. He does report that his left leg is feeling better overall. He denies any pain. He does continue to have significant swelling. He is currently wearing compression and does elevate a little.   No Known Allergies  Current Facility-Administered Medications   Medication Dose Route Frequency Provider Last Rate Last Admin   0.9 %  sodium chloride infusion   Intravenous Continuous Andrew John, MD 100 mL/hr at 04/14/22 0830 New Bag at 04/14/22 0830   fentaNYL (SUBLIMAZE) injection    PRN Andrew John, MD   50 mcg at 04/14/22 0843   Heparin (Porcine) in NaCl 1000-0.9 UT/500ML-% SOLN    PRN Andrew John, MD   500 mL at 04/14/22 0841   iodixanol (VISIPAQUE) 320 MG/ML injection    PRN Andrew John, MD   50 mL at 04/14/22 0917   lidocaine (PF) (XYLOCAINE) 1 % injection    PRN Andrew John, MD   15 mL at 04/14/22 2992   midazolam (VERSED) injection    PRN Andrew John, MD   1 mg at 04/14/22 0843     ROS:  See HPI  Physical Exam:  Vitals:   04/14/22 0905 04/14/22 0910  BP: 110/76 110/76  Pulse: 84 82  Resp: 20   Temp:    SpO2: 95%    General: well appearing, in no acute distress Cardiac: regular Lungs: non labored Extremities:  BLE well perfused and warm with palpable DP and PT pulses. Left leg is very edematous throughout. Small pinpoint area of weeping on left anterior leg. No erythema. No tenderness Neuro: alert and oriented  Non invasive vascular lab: VAS [place] IVC/Iliac: Summary:  IVC/Iliac: Visualization of Inferior Vena Cava and proximal left common iliac vein common Iliac was limited. Occlusive acute thrombus observed within the common iliac vein and  external iliac vein.   VAS [place] lower extremity Venous: Summary:  LEFT:  - Findings consistent with acute deep vein thrombosis involving the left common femoral vein, SF junction, left femoral vein, left popliteal vein, and left gastrocnemius veins.   Assessment/Plan:  This is a 62 y.o. male who is s/p: s/p Iliocaval venogram, left lower extremity venogram; Percutaneous mechanical thrombectomy using Inari clot Treiver; Intravascular ultrasound (IVUS) and Balloon venoplasty of the left common iliac vein, external iliac vein, common femoral vein 12 x 60 mm on 02/17/22 by  Dr. Stanford Breed. Stent was not used at the time and previous discussion by Dr. Virl Cagey with patient was that if there was recurrence of DVT that it could then be managed with stenting or with anticoagulation alone. Presently patient has improved symptoms from initial presentation. He does continue to have significant swelling in left leg. No signs of phlegmasia. He does wear compression and elevate sometimes.  - unfortunately duplex shows recurrence of left common iliac and external iliac vein DVT as well as in left common femoral, SFJ, left femoral, popliteal and gastrocnemius veins - I have discussed with him importance of taking his Eliquis daily - encourage him to continue compression and elevation - Re discussed with him re peat percutaneous mechanical thrombectomy with iliac veins stenting vs anticoagulation alone. Discussed risk vs benefits and he would ultimately like to proceed with intervention - I will arrange this in the near future with Dr. Virl Cagey.    Paulo Fruit, [facility] [phone]   Clinic MD:  Virl Cagey

## 2022-04-14 NOTE — Op Note (Signed)
    Patient name: Andrew Kaiser MRN: [id number] DOB: 1960-11-14 Sex: male  04/14/2022 Pre-operative Diagnosis: Left lower extremity iliofemoral deep venous thrombosis Post-operative diagnosis:  Same Surgeon:  [name], MD Procedure Performed: 1.  Ultrasound-guided access of the left popliteal vein 2.  Left lower extremity venogram 3.  Cavogram   Indications:  In short, Andrew Kaiser is a 62 year old male with history of left lower extremity DVT status post left mechanical thrombectomy with balloon venoplasty.  At the time, the DVT was defined as, therefore stenosis present at the left common iliac vein was not stented. He was last seen in our office roughly 2 weeks ago.  He has been compliant with Eliquis other than missing 2 to 3 days due to a misunderstanding regarding refills.  Overall, he feels better, denies pain, but continues to have swelling in the left lower extremity.  He is currently wearing compression stockings, but has been less compliant with elevation. Recent imaging demonstrated reocclusion of the left-sided iliac veins, common femoral vein, femoral vein.  After discussing the risk and benefits of repeat left lower extremity thrombectomy with stenting to prevent further embolic events, [name] elected to proceed.  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 There are diffuse, large venous collaterals and the right lower extremity providing flow to the inferior vena cava.  No cross pelvic collaterals appreciated.   Procedure:  The patient was identified in the holding area and taken to room 8.  The patient was then placed prone on the table and prepped and draped in the usual sterile fashion.  A timeout was called.  Ultrasound was used to evaluate the left popliteal vein.  A digital ultrasound image was acquired.  A micropuncture needle was used to access the left popliteal vein from the prone position.  The  micropuncture sheath was exchanged for a 5 [place] sheath.  There was difficulty passing a wire more proximally, therefore left lower extremity venogram followed including cavogram.  See findings above.  I elected to try new access in the small saphenous vein in an effort to find to the native flow channel.  An ultrasound guided micropuncture needle was used for access.  The wire, again, would not pass, and diffuse collaterals were appreciated.  At this point, I terminated the case.   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 Andrew Kaiser has diffuse collaterals within the left lower extremity, which are large and provide flow to the inferior vena cava without filling cross-pelvic collaterals from the hypogastric veins. On physical exam, [name] thigh is soft, the majority of the swelling is in the calf due to residual deep venous thrombosis.  Medical treatment for this is anticoagulation, compression, elevation.  He should continue Eliquis. Hypercoagulable workup pending with appointment scheduled for 04/16/2022.    [name], MD [facility] [place] Office: [phone]

## 2022-04-15 ENCOUNTER — Encounter (HOSPITAL_COMMUNITY): Payer: Self-pay | Admitting: Vascular Surgery

## 2022-04-15 ENCOUNTER — Ambulatory Visit (INDEPENDENT_AMBULATORY_CARE_PROVIDER_SITE_OTHER): Payer: Medicaid Other | Admitting: Mental Health

## 2022-04-15 DIAGNOSIS — F3130 Bipolar disorder, current episode depressed, mild or moderate severity, unspecified: Secondary | ICD-10-CM

## 2022-04-15 NOTE — Progress Notes (Signed)
THERAPIST PROGRESS NOTE Virtual Visit via Video Note  I connected with TIMTOHY BROSKI on 04/15/22 at 12:30 PM EST by a video enabled telemedicine application and verified that I am speaking with the correct person using two identifiers.  Location: Patient: home address on file  Provider: office    I discussed the limitations of evaluation and management by telemedicine and the availability of in person appointments. The patient expressed understanding and agreed to proceed.  I discussed the assessment and treatment plan with the patient. The patient was provided an opportunity to ask questions and all were answered. The patient agreed with the plan and demonstrated an understanding of the instructions.   The patient was advised to call back or seek an in-person evaluation if the symptoms worsen or if the condition fails to improve as anticipated.  I provided 48 minutes of non-face-to-face time during this encounter.   Marion Downer, [facility]   Session Time: 12:31pm (48 minutes)   Participation Level: Active  Behavioral Response: CasualAlertEuthymic  Type of Therapy: Individual Therapy  Treatment Goals addressed: STG: Tameron "[name]" will increase stability in moods AEB development of x 3 effective coping skills with ability to develop daily routine within the next 6 moths    STG: [employer]" increase stability in moods AEB daily medication compliance within the next 6 months     ProgressTowards Goals: Progressing  Interventions: Supportive  Summary:  JAKOBI THETFORD is a 62 y.o. male who presents with Bipolar disorder current episode depressed. [name] presents alert and oriented; mood and affect stable; euthymic. Noticeable increase since previous session. Speech clear and coherent at normal rate and tone. Groomed in well manner with neat presentation; dressed well. Shares to have just recently had procedure for blood clots in his legs and reports decreased pain with  walking and improvement with physical health. Shares to feel as if moods have improved as well. Shares access to a vehicle now with cousin allowing him to use a truck indefinitely, presented to social security to inquire about disability benefits. Shares has been referred to CST services and shares hesitancy with having to cease seeing current therapist provided rapport and hx. Able to process wit therapist benefits of CST services and access to additional support. Agrees to move forward with CST services with ability to step back down to OPT at conclusion of services. Completed and reviewed PHQ and GAD scores and shares improvement in sxs in the past x 2 weeks. Agrees to move forward with CST with no current follow up needed.   Suicidal/Homicidal: Nowithout intent/plan  Therapist Response:  Therapist engaged [name] in [employer]. Confirmed confidential space. Reviewed previous session. Assessed for level of functioning, sxs management; level of stressors and sxs management. Explored with [name] factors that have contributed to increase level of functioning and sxs management. Explored positive changes and abilty to follow up with disability. Educated on CST services and accuracy of not being able to have OPT and CST at the same time. Educated on ability to step back down to OPT at conclusion of CST services. Reviewed hx of OPT txt and working to continue with further progress and stability in the community with CST. Completed and reviewed PHQ and GAD scores. Reviewed session and encouraged to contact CST at conclusion of session. Assessed for safety concerns.   Plan: Follow up with CST services   Diagnosis: Bipolar I disorder, most recent episode depressed ([place])  Collaboration of Care: Other None  Patient/Guardian was advised Release  of Information must be obtained prior to any record release in order to collaborate their care with an outside provider. Patient/Guardian was advised if they have not  already done so to contact the registration department to sign all necessary forms in order for [place] to release information regarding their care.   Consent: Patient/Guardian gives verbal consent for treatment and assignment of benefits for services provided during this visit. Patient/Guardian expressed understanding and agreed to proceed.   Rockey Situ [place], [facility] 04/15/2022

## 2022-04-16 ENCOUNTER — Inpatient Hospital Stay: Payer: Medicaid Other

## 2022-04-16 ENCOUNTER — Inpatient Hospital Stay: Payer: Medicaid Other | Attending: Hematology and Oncology | Admitting: Hematology and Oncology

## 2022-04-16 ENCOUNTER — Encounter: Payer: Self-pay | Admitting: Hematology and Oncology

## 2022-04-16 ENCOUNTER — Other Ambulatory Visit: Payer: Self-pay

## 2022-04-16 ENCOUNTER — Telehealth: Payer: Self-pay | Admitting: Hematology and Oncology

## 2022-04-16 VITALS — BP 121/83 | HR 75 | Temp 97.9°F | Resp 16 | Ht 72.0 in | Wt 206.0 lb

## 2022-04-16 DIAGNOSIS — I2694 Multiple subsegmental pulmonary emboli without acute cor pulmonale: Secondary | ICD-10-CM | POA: Diagnosis not present

## 2022-04-16 DIAGNOSIS — Z7901 Long term (current) use of anticoagulants: Secondary | ICD-10-CM | POA: Insufficient documentation

## 2022-04-16 DIAGNOSIS — I82402 Acute embolism and thrombosis of unspecified deep veins of left lower extremity: Secondary | ICD-10-CM

## 2022-04-16 DIAGNOSIS — I82422 Acute embolism and thrombosis of left iliac vein: Secondary | ICD-10-CM | POA: Diagnosis present

## 2022-04-16 DIAGNOSIS — F17290 Nicotine dependence, other tobacco product, uncomplicated: Secondary | ICD-10-CM | POA: Insufficient documentation

## 2022-04-16 DIAGNOSIS — I824Y2 Acute embolism and thrombosis of unspecified deep veins of left proximal lower extremity: Secondary | ICD-10-CM

## 2022-04-16 NOTE — Progress Notes (Signed)
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  Patient is here for an initial visit.  He reports improvement in lower extremity swelling with compression stockings and improvement in shortness of breath since he started anticoagulation.  He denies any complications with anticoagulation.  We have discussed about role of hypercoagulable workup in patients with no evidence of clear provoking factors.  He does report some family history and his sister who had multiple blood clots and is on chronic anticoagulation.  Physical examination today demonstrated significant left lower extremity swelling.  No other major findings.  I agree that it is reasonable to consider hypercoagulable workup in this patient.  I also wonder if he needs indefinite anticoagulation if he does indeed have May Thurner syndrome per previous records and if there is no imminent surgical correction plan.  I will discussed this with Dr. Unk Lightning from vascular surgery and confirm this.  In the meantime he was encouraged to stay compliant with anticoagulation and return to clinic in 3 months for follow-up.  He will proceed with hypercoagulable workup today.  Age-appropriate cancer screening recommended.  HISTORY OF PRESENTING ILLNESS:  Andrew Kaiser 62 y.o. male is here because of Iliofemoral DVT  This is a very pleasant  62 year old male patient with a history of left lower extremity DVT status post mechanical thrombectomy and balloon venoplasty in December 2023 with imaging findings also suggesting compression at the level of left common iliac vein concerning for May Thurner syndrome, seen again recently on 04/14/2022 on Eliquis with recent imaging demonstrating reocclusion of the left-sided iliac veins common femoral vein and femoral vein and had repeat attempt at left lower extremity thrombectomy with stenting however this was unsuccessful and according to the op note there were diffuse collaterals appreciated and the findings were thought to be chronic occluded left common iliac, external iliac and femoral vein referred to hematology for recommendations.   He says back in December, he had lot of stressors, and remembers having left lower extremity pain which was not well controlled. So he went to the hospital and was found to have DVT and PE as mentioned above.  He had most recent CT angio imaging which once again showed mild residual bilateral lower lobe segmental and subsegmental pulmonary emboli improved in appearance compared to December. He is followed by Dr. Virl Cagey from vascular surgery.  Since he started Eliquis, he has noticed improved LLE swelling and shortness of breath. No bleeding issues with Eliquis, Sister has multiple blood clots and currently on anti coagulation. NO other known family history of DVT/PE. No other pertinent review of systems.  He had his last colonoscopy about 8 years ago and has no known family history of colon cancer.  He denies any change in breathing other than the recent shortness of breath from the PE which is improving, no new hematochezia or melena no change in urinary habits.  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   PFO (patent foramen ovale)    Stroke [facility])    x3    SURGICAL HISTORY: Past Surgical History:  Procedure Laterality Date   COLONOSCOPY     IVC VENOGRAPHY N/A 04/14/2022   Procedure: IVC Venography;  Surgeon: [name], MD;  Location: [place] [facility];  Service: Cardiovascular;  Laterality: N/A;   LOWER EXTREMITY VENOGRAPHY Left 04/14/2022   Procedure: LOWER EXTREMITY VENOGRAPHY;  Surgeon: [name], MD;  Location: [place] [facility];  Service: Cardiovascular;  Laterality: Left;   PATENT FORAMEN OVALE CLOSURE  2006   PERIPHERAL VASCULAR BALLOON ANGIOPLASTY Left 02/17/2022   Procedure: PERIPHERAL VASCULAR BALLOON ANGIOPLASTY;  Surgeon: [name], MD;  Location: [place] [facility];  Service: Cardiovascular;  Laterality: Left;   PERIPHERAL VASCULAR THROMBECTOMY N/A 02/17/2022   Procedure: VENOUS THROMBECTOMY;  Surgeon: [name], MD;  Location: [place] [facility];  Service: Cardiovascular;  Laterality: N/A;   POLYPECTOMY     RHINOPLASTY      SOCIAL HISTORY: Social History   Socioeconomic History   Marital status: Divorced    Spouse name: Not on file   Number of children: 1   Years of education: 15   Highest education level: Not on file  Occupational History   Occupation: unemployed  Tobacco Use   Smoking status: Some Days    Types: Cigars    Passive exposure: Current   Smokeless tobacco: Never  Vaping Use   Vaping Use: Never used  Substance and Sexual Activity   Alcohol use: Yes    Alcohol/week: 0.0 standard drinks of alcohol    Comment: socially   Drug use: No   Sexual activity: Not on file  Other Topics Concern   Not on file  Social History Narrative   Patient drinks caffeine occasionally.   Patient is left handed.   Social Determinants of Health   Financial Resource Strain: High Risk (11/24/2021)   Overall Financial Resource Strain (CARDIA)    Difficulty of Paying Living Expenses: Hard  Food  Insecurity: No Food Insecurity (02/18/2022)   Hunger Vital Sign    Worried About Running Out of Food in the Last Year: Never true    Ran Out of Food in the Last Year: Never true  Transportation Needs: Unmet Transportation Needs (02/18/2022)   PRAPARE - Transportation    Lack of Transportation (Medical): Yes    Lack of Transportation (Non-Medical): Yes  Physical Activity: Sufficiently Active (11/24/2021)   Exercise Vital Sign    Days of Exercise per Week: 7 days    Minutes of Exercise per Session: 30 min  Stress: Stress Concern Present (11/24/2021)   [place]    Feeling of Stress : To some extent  Social Connections: Socially Isolated (11/24/2021)   Social Connection and Isolation Panel [NHANES]    Frequency of Communication with Friends and Family: More than three times a week    Frequency of Social Gatherings with Friends and Family: More than three times a week    Attends Religious Services: Never    Marine scientist or Organizations: No    Attends Archivist Meetings: Never    Marital Status: Divorced  Human resources officer  Violence: Not At Risk (02/18/2022)   Humiliation, Afraid, Rape, and Kick questionnaire    Fear of Current or Ex-Partner: No    Emotionally Abused: No    Physically Abused: No    Sexually Abused: No    FAMILY HISTORY: Family History  Problem Relation Age of Onset   Stroke Mother    Heart disease Father    Asthma Father    Stroke Sister    Multiple sclerosis Sister    Colon cancer Neg Hx    Pancreatic cancer Neg Hx    Rectal cancer Neg Hx    Stomach cancer Neg Hx    Esophageal cancer Neg Hx    Prostate cancer Neg Hx    Colon polyps Neg Hx     ALLERGIES:  has No Known Allergies.  MEDICATIONS:  Current Outpatient Medications  Medication Sig Dispense Refill   apixaban (ELIQUIS) 5 MG TABS tablet Take 1 tablet (5 mg total) by mouth 2 (two) times daily. 180 tablet 0    hydrOXYzine (ATARAX) 25 MG tablet Take 25-50 mg by mouth See admin instructions. Take '25mg'$  by mouth three times a day. Take '50mg'$  daily at bedtime as needed for sleep or anxiety.     mirtazapine (REMERON) 15 MG tablet TAKE 1 TABLET BY MOUTH AT BEDTIME (Patient taking differently: Take 15 mg by mouth at bedtime.) 30 tablet 2   Oxcarbazepine (TRILEPTAL) 300 MG tablet Take 300 mg by mouth 2 (two) times daily.     risperiDONE (RISPERDAL) 2 MG tablet Take 2 mg by mouth at bedtime.     rosuvastatin (CRESTOR) 10 MG tablet Take 1 tablet (10 mg total) by mouth daily. 30 tablet 0   No current facility-administered medications for this visit.     PHYSICAL EXAMINATION: ECOG PERFORMANCE STATUS: 0 - Asymptomatic  Vitals:   04/16/22 0832  BP: 121/83  Pulse: 75  Resp: 16  Temp: 97.9 F (36.6 C)  SpO2: 100%   Filed Weights   04/16/22 0832  Weight: 206 lb (93.4 kg)    GENERAL:alert, no distress and comfortable SKIN: skin color, texture, turgor are normal, no rashes or significant lesions EYES: normal, conjunctiva are pink and non-injected, sclera clear OROPHARYNX:no exudate, no erythema and lips, buccal mucosa, and tongue normal  NECK: supple, thyroid normal size, non-tender, without nodularity LYMPH:  no palpable lymphadenopathy in the cervical, axillary  LUNGS: clear to auscultation and percussion with normal breathing effort HEART: regular rate & rhythm and no murmurs and no lower extremity edema ABDOMEN:abdomen soft, non-tender and normal bowel sounds Musculoskeletal: LLE swelling noted. PSYCH: alert & oriented x 3 with fluent speech NEURO: no focal motor/sensory deficits  LABORATORY DATA:  I have reviewed the data as listed Lab Results  Component Value Date   WBC 5.3 04/04/2022   HGB 12.2 (L) 04/14/2022   HCT 36.0 (L) 04/14/2022   MCV 92.1 04/04/2022   PLT 239 04/04/2022     Chemistry      Component Value Date/Time   NA 139 04/14/2022 0747   NA 143 06/06/2019 1152   K 3.8  04/14/2022 0747   CL 101 04/14/2022 0747   CO2 26 04/04/2022 1854   BUN 12 04/14/2022 0747   BUN 23 06/06/2019 1152   CREATININE 1.00 04/14/2022 0747      Component Value Date/Time   CALCIUM 8.7 (L) 04/04/2022 1854   ALKPHOS 55 02/14/2022 0456   AST 50 (H) 02/14/2022 0456   ALT 59 (H) 02/14/2022 0456   BILITOT 1.0  02/14/2022 0456   BILITOT 0.6 06/06/2019 1152       RADIOGRAPHIC STUDIES: I have personally reviewed the radiological images as listed and agreed with the findings in the report. PERIPHERAL VASCULAR CATHETERIZATION  Result Date: 04/14/2022 Images from the original result were not included.   Patient name: Andrew Kaiser     MRN: [id number]        DOB: April 07, 1960          Sex: male  04/14/2022 Pre-operative Diagnosis: Left lower extremity iliofemoral deep venous thrombosis Post-operative diagnosis:  Same Surgeon:  [name], MD Procedure Performed: 1.  Ultrasound-guided access of the left popliteal vein 2.  Left lower extremity venogram 3.  Cavogram   Indications:  In short, Andrew Kaiser is a 63 year old male with history of left lower extremity DVT status post left mechanical thrombectomy with balloon venoplasty.  At the time, the DVT was defined as, therefore stenosis present at the left common iliac vein was not stented. He was last seen in our office roughly 2 weeks ago.  He has been compliant with Eliquis other than missing 2 to 3 days due to a misunderstanding regarding refills.  Overall, he feels better, denies pain, but continues to have swelling in the left lower extremity.  He is currently wearing compression stockings, but has been less compliant with elevation. Recent imaging demonstrated reocclusion of the left-sided iliac veins, common femoral vein, femoral vein.  After discussing the risk and benefits of repeat left lower extremity thrombectomy with stenting to prevent further embolic events, Andrew Kaiser elected to proceed.  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 There are diffuse, large venous collaterals and the right lower extremity providing flow to the inferior vena cava.  No cross pelvic collaterals appreciated.             Procedure:  The patient was identified in the holding area and taken to room 8.  The patient was then placed prone on the table and prepped and draped in the usual sterile fashion.  A timeout was called.  Ultrasound was used to evaluate the left popliteal vein.  A digital ultrasound image was acquired.  A micropuncture needle was used to access the left popliteal vein from the prone position.  The micropuncture sheath was exchanged for a 5 [place] sheath.  There was difficulty passing a wire more proximally, therefore left lower extremity venogram followed including cavogram.  See findings above.  I elected to try new access in the small saphenous vein in an effort to find to the native flow channel.  An ultrasound guided micropuncture needle was used for access.  The wire, again, would not pass, and diffuse collaterals were appreciated.  At this point, I terminated the case.   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 Andrew Kaiser has diffuse collaterals within the left lower extremity, which are large and provide flow to the inferior vena cava without filling cross-pelvic collaterals from the hypogastric veins. On physical exam, Andrew Kaiser thigh is soft, the majority of the swelling is in the calf due to residual deep venous thrombosis.  Medical treatment for this is anticoagulation, compression, elevation.  He should continue Eliquis. Hypercoagulable workup pending with appointment scheduled for 04/16/2022.    [name], MD [facility] Office: [phone]    CT Angio Chest PE W/Cm &/Or Wo Cm  Result Date: 04/04/2022 CLINICAL DATA:  Suspicion  for pulmonary embolism, history of recent DVT. EXAM: CT ANGIOGRAPHY CHEST  WITH CONTRAST TECHNIQUE: Multidetector CT imaging of the chest was performed using the standard protocol during bolus administration of intravenous contrast. Multiplanar CT image reconstructions and MIPs were obtained to evaluate the vascular anatomy. RADIATION DOSE REDUCTION: This exam was performed according to the departmental dose-optimization program which includes automated exposure control, adjustment of the mA and/or kV according to patient size and/or use of iterative reconstruction technique. CONTRAST:  58m OMNIPAQUE IOHEXOL 350 MG/ML SOLN COMPARISON:  02/13/2022. FINDINGS: Cardiovascular: The heart is normal in size and there is no pericardial effusion. An atrial septal occlusion device is noted the aorta and pulmonary trunk are normal in caliber. Small segmental and subsegmental pulmonary emboli are present in the lower lobes bilaterally and likely residual embolus from the prior exam. The right ventricle is mildly distended with a RV/LV ratio 0.99. No interventricular septal bowing. Mediastinum/Nodes: No mediastinal, hilar, or axillary lymphadenopathy. The thyroid gland, trachea, and esophagus are within normal limits. Lungs/Pleura: A small amount of debris is noted in the right mainstem bronchus. Bronchial wall thickening is noted bilaterally. There is atelectasis at the lung bases. No effusion or pneumothorax. Upper Abdomen: No acute abnormality. Musculoskeletal: Degenerative changes in the thoracic spine. No acute osseous abnormality. Review of the MIP images confirms the above findings. IMPRESSION: Mild residual bilateral lower lobe segmental and subsegmental pulmonary emboli, improved in appearance from the prior exam. Electronically Signed   By: LBrett FairyM.D.   On: 04/04/2022 23:53   VAS [place]IVC/ILIAC (VENOUS ONLY)  Result Date: 04/02/2022 IVC/ILIAC STUDY Patient Name:  DRAMAJ FRANGOS Date of Exam:   04/02/2022 Medical Rec #: [id number]        Accession #:    [id number] of Birth:  705/23/1962        Patient Gender: M Patient Age:   89years [facility] Procedure:      VAS [place]IVC/ILIAC (VENOUS ONLY) Referring Phys: JVonna KotykROBINS --------------------------------------------------------------------------------  Indications: Left iliofemoral DVT follow up evaluation Vascular Interventions: 02/17/2022                         Pre-operative Diagnosis: Left lower extremity                         iliofemoral deep venous thrombosis                         Post-operative diagnosis: Same                         Surgeon: JBroadus John MD                         Procedure Performed:                         1. Guided micropuncture access of the left popliteal                         vein                         2. Iliocaval venogram, left lower extremity venogram  3. Percutaneous mechanical thrombectomy using Inari clot                         [place]                         4. Intravascular ultrasound (IVUS)                         5. Balloon venoplasty of the left common iliac vein,                         external iliac vein, common femoral vein 12 x 60 mm                         6. Moderate sedation time 62 minutes                         7. Contrast volume 70 mL. Limitations: Air/bowel gas.  Performing Technologist: Ronal Fear RVS, RCS  Examination Guidelines: A complete evaluation includes B-mode imaging, spectral Doppler, color Doppler, and power Doppler as needed of all accessible portions of each vessel. Bilateral testing is considered an integral part of a complete examination. Limited examinations for reoccurring indications may be performed as noted.  IVC/Iliac Findings: +----------+------+--------+--------------+    IVC    PatentThrombus   Comments    +----------+------+--------+--------------+ IVC Prox                not visualized +----------+------+--------+--------------+ IVC Mid                 not visualized  +----------+------+--------+--------------+ IVC Distal              not visualized +----------+------+--------+--------------+  +-------------------+---------+-----------+---------+-----------+--------------+         CIV        RT-PatentRT-ThrombusLT-PatentLT-Thrombus   Comments    +-------------------+---------+-----------+---------+-----------+--------------+ Common Iliac Prox                                          not visualized +-------------------+---------+-----------+---------+-----------+--------------+ Common Iliac Mid                                   acute                  +-------------------+---------+-----------+---------+-----------+--------------+ Common Iliac Distal                                acute                  +-------------------+---------+-----------+---------+-----------+--------------+  +-----------------+---------+-----------+---------+-----------+---------------+        EIV       RT-PatentRT-ThrombusLT-PatentLT-Thrombus   Comments     +-----------------+---------+-----------+---------+-----------+---------------+ External Iliac                                   acute                   Vein Prox                                                                +-----------------+---------+-----------+---------+-----------+---------------+  External Iliac                                   acute   trickle channel Vein Mid                                                     of flow     +-----------------+---------+-----------+---------+-----------+---------------+ External Iliac                                   acute                   Vein Distal                                                              +-----------------+---------+-----------+---------+-----------+---------------+   Summary: IVC/Iliac: Visualization of Inferior Vena Cava and proximal left common iliac vein common Iliac was limited.  Occlusive acute thrombus observed within the common iliac vein and external iliac vein.  *See table(s) above for measurements and observations.  Electronically signed by Orlie Pollen on 04/02/2022 at 5:49:18 PM.    Final    VAS [place] LOWER EXTREMITY VENOUS (DVT)  Result Date: 04/02/2022  Lower Venous DVT Study Patient Name:  KOBEN DAMAN  Date of Exam:   04/02/2022 Medical Rec #: [id number]         Accession #:    [id number] Date of Birth:                   Patient Gender: Patient Age: [facility]am [facility] Procedure:      VAS [place] LOWER EXTREMITY VENOUS (DVT) Referring Phys: --------------------------------------------------------------------------------  Indications: Left iliofemoral DVT evaluation following thrombectomy 02/17/2022.  Performing Technologist: Ronal Fear RVS, RCS  Examination Guidelines: A complete evaluation includes B-mode imaging, spectral Doppler, color Doppler, and power Doppler as needed of all accessible portions of each vessel. Bilateral testing is considered an integral part of a complete examination. Limited examinations for reoccurring indications may be performed as noted. The reflux portion of the exam is performed with the patient in reverse Trendelenburg.  +---------+---------------+---------+-----------+----------+--------------+ LEFT     CompressibilityPhasicitySpontaneityPropertiesThrombus Aging +---------+---------------+---------+-----------+----------+--------------+ CFV      None                                         Acute          +---------+---------------+---------+-----------+----------+--------------+ SFJ      None                                         Acute          +---------+---------------+---------+-----------+----------+--------------+ FV Prox  None  Acute          +---------+---------------+---------+-----------+----------+--------------+ FV Mid   None                                          Acute          +---------+---------------+---------+-----------+----------+--------------+ FV DistalNone                                         Acute          +---------+---------------+---------+-----------+----------+--------------+ POP      None                                         Acute          +---------+---------------+---------+-----------+----------+--------------+ Gastroc  None                                         Acute          +---------+---------------+---------+-----------+----------+--------------+   Left Technical Findings: Not visualized segments include calf veins.   Summary: LEFT: - Findings consistent with acute deep vein thrombosis involving the left common femoral vein, SF junction, left femoral vein, left popliteal vein, and left gastrocnemius veins.  *See table(s) above for measurements and observations. Electronically signed by Orlie Pollen on 04/02/2022 at 5:49:02 PM.    Final     All questions were answered. The patient knows to call the clinic with any problems, questions or concerns. I spent 45 minutes in the care of this patient including H and P, review of records, counseling and coordination of care.     Benay Pike, MD 04/16/2022 8:49 AM

## 2022-04-16 NOTE — Telephone Encounter (Signed)
Contacted patient to scheduled appointments. Patient is aware of appointments that are scheduled.   

## 2022-04-19 ENCOUNTER — Inpatient Hospital Stay: Payer: Medicaid Other

## 2022-04-20 ENCOUNTER — Inpatient Hospital Stay: Payer: Medicaid Other

## 2022-04-20 DIAGNOSIS — I82402 Acute embolism and thrombosis of unspecified deep veins of left lower extremity: Secondary | ICD-10-CM

## 2022-04-20 DIAGNOSIS — I82422 Acute embolism and thrombosis of left iliac vein: Secondary | ICD-10-CM | POA: Diagnosis not present

## 2022-04-20 DIAGNOSIS — I824Y2 Acute embolism and thrombosis of unspecified deep veins of left proximal lower extremity: Secondary | ICD-10-CM

## 2022-04-20 LAB — ANTITHROMBIN III: AntiThromb III Func: 105 % (ref 75–120)

## 2022-04-21 LAB — BETA-2-GLYCOPROTEIN I ABS, IGG/M/A
Beta-2 Glyco I IgG: <9 GPI IgG units (ref 0–20)
Beta-2-Glycoprotein I IgA: <9 GPI IgA units (ref 0–25)
Beta-2-Glycoprotein I IgM: <9 GPI IgM units (ref 0–32)

## 2022-04-21 LAB — LUPUS ANTICOAGULANT PANEL
DRVVT: 47.7 s — ABNORMAL HIGH (ref 0.0–47.0)
PTT Lupus Anticoagulant: 33.6 s (ref 0.0–43.5)

## 2022-04-21 LAB — PROTEIN S ACTIVITY: Protein S Activity: 81 % (ref 63–140)

## 2022-04-21 LAB — PROTEIN C ACTIVITY: Protein C Activity: 65 % — ABNORMAL LOW (ref 73–180)

## 2022-04-21 LAB — DRVVT MIX: dRVVT Mix: 42.8 s — ABNORMAL HIGH (ref 0.0–40.4)

## 2022-04-21 LAB — DRVVT CONFIRM: dRVVT Confirm: 1.1 ratio (ref 0.8–1.2)

## 2022-04-22 LAB — CARDIOLIPIN ANTIBODIES, IGG, IGM, IGA
Anticardiolipin IgA: <9 APL U/mL (ref 0–11)
Anticardiolipin IgG: <9 GPL U/mL (ref 0–14)
Anticardiolipin IgM: <9 MPL U/mL (ref 0–12)

## 2022-04-26 LAB — HEXAGONAL PHOSPHOLIPID NEUTRALIZATION: Hexagonal Phospholipid Neutral: 4 s

## 2022-04-29 LAB — PROTHROMBIN GENE MUTATION

## 2022-04-29 LAB — FACTOR 5 LEIDEN

## 2022-04-30 ENCOUNTER — Encounter (HOSPITAL_COMMUNITY): Payer: Self-pay | Admitting: Vascular Surgery

## 2022-05-04 ENCOUNTER — Telehealth (HOSPITAL_COMMUNITY): Payer: Self-pay | Admitting: Mental Health

## 2022-05-04 NOTE — Telephone Encounter (Signed)
Therapist received VM from [facility] with [place]. Therapist returned call (ROI) on file. Inquired about pt attending appointments. Educated therapist no longer seeing pt and now engaged with [facility]. Educated on not able to have OPT therapist and [facility] therapist with [facility] being an enhanced services with therapist on staff. Jasmine thanked therapist for information.

## 2022-07-15 ENCOUNTER — Inpatient Hospital Stay: Payer: Medicaid Other | Attending: Hematology and Oncology | Admitting: Hematology and Oncology

## 2022-11-18 ENCOUNTER — Emergency Department (HOSPITAL_COMMUNITY): Payer: MEDICAID

## 2022-11-18 ENCOUNTER — Other Ambulatory Visit: Payer: Self-pay

## 2022-11-18 ENCOUNTER — Other Ambulatory Visit: Payer: Self-pay | Admitting: Emergency Medicine

## 2022-11-18 ENCOUNTER — Emergency Department (HOSPITAL_COMMUNITY)
Admission: EM | Admit: 2022-11-18 | Discharge: 2022-11-18 | Disposition: A | Discharge status: Home / Self Care | Payer: MEDICAID | Attending: Emergency Medicine | Admitting: Emergency Medicine

## 2022-11-18 DIAGNOSIS — R110 Nausea: Secondary | ICD-10-CM | POA: Diagnosis not present

## 2022-11-18 DIAGNOSIS — R109 Unspecified abdominal pain: Secondary | ICD-10-CM | POA: Insufficient documentation

## 2022-11-18 DIAGNOSIS — I825Z2 Chronic embolism and thrombosis of unspecified deep veins of left distal lower extremity: Secondary | ICD-10-CM

## 2022-11-18 DIAGNOSIS — Z7901 Long term (current) use of anticoagulants: Secondary | ICD-10-CM | POA: Diagnosis not present

## 2022-11-18 DIAGNOSIS — M7989 Other specified soft tissue disorders: Secondary | ICD-10-CM

## 2022-11-18 DIAGNOSIS — Z1152 Encounter for screening for COVID-19: Secondary | ICD-10-CM | POA: Insufficient documentation

## 2022-11-18 DIAGNOSIS — M79605 Pain in left leg: Secondary | ICD-10-CM

## 2022-11-18 DIAGNOSIS — R2242 Localized swelling, mass and lump, left lower limb: Secondary | ICD-10-CM | POA: Diagnosis present

## 2022-11-18 DIAGNOSIS — I82502 Chronic embolism and thrombosis of unspecified deep veins of left lower extremity: Secondary | ICD-10-CM | POA: Insufficient documentation

## 2022-11-18 DIAGNOSIS — R059 Cough, unspecified: Secondary | ICD-10-CM | POA: Insufficient documentation

## 2022-11-18 DIAGNOSIS — R0602 Shortness of breath: Secondary | ICD-10-CM | POA: Insufficient documentation

## 2022-11-18 DIAGNOSIS — I745 Embolism and thrombosis of iliac artery: Secondary | ICD-10-CM

## 2022-11-18 LAB — LACTIC ACID, PLASMA: Lactic Acid, Venous: 0.9 mmol/L (ref 0.5–1.9)

## 2022-11-18 LAB — CBC WITH DIFFERENTIAL/PLATELET
Abs Immature Granulocytes: 0.03 10*3/uL (ref 0.00–0.07)
Basophils Absolute: 0 10*3/uL (ref 0.0–0.1)
Basophils Relative: 0 %
Eosinophils Absolute: 0.2 10*3/uL (ref 0.0–0.5)
Eosinophils Relative: 3 %
HCT: 35.2 % — ABNORMAL LOW (ref 39.0–52.0)
Hemoglobin: 11.5 g/dL — ABNORMAL LOW (ref 13.0–17.0)
Immature Granulocytes: 0 %
Lymphocytes Relative: 16 %
Lymphs Abs: 1.1 10*3/uL (ref 0.7–4.0)
MCH: 29.9 pg (ref 26.0–34.0)
MCHC: 32.7 g/dL (ref 30.0–36.0)
MCV: 91.4 fL (ref 80.0–100.0)
Monocytes Absolute: 0.6 10*3/uL (ref 0.1–1.0)
Monocytes Relative: 9 %
Neutro Abs: 4.9 10*3/uL (ref 1.7–7.7)
Neutrophils Relative %: 72 %
Platelets: 182 10*3/uL (ref 150–400)
RBC: 3.85 MIL/uL — ABNORMAL LOW (ref 4.22–5.81)
RDW: 12.7 % (ref 11.5–15.5)
WBC: 6.9 10*3/uL (ref 4.0–10.5)
nRBC: 0 % (ref 0.0–0.2)

## 2022-11-18 LAB — COMPREHENSIVE METABOLIC PANEL
ALT: 15 U/L (ref 0–44)
AST: 17 U/L (ref 15–41)
Albumin: 3.2 g/dL — ABNORMAL LOW (ref 3.5–5.0)
Alkaline Phosphatase: 39 U/L (ref 38–126)
Anion gap: 9 (ref 5–15)
BUN: 15 mg/dL (ref 8–23)
CO2: 26 mmol/L (ref 22–32)
Calcium: 8.5 mg/dL — ABNORMAL LOW (ref 8.9–10.3)
Chloride: 103 mmol/L (ref 98–111)
Creatinine, Ser: 1.05 mg/dL (ref 0.61–1.24)
GFR, Estimated: >60 mL/min (ref >60)
Glucose, Bld: 96 mg/dL (ref 70–99)
Potassium: 3.6 mmol/L (ref 3.5–5.1)
Sodium: 138 mmol/L (ref 135–145)
Total Bilirubin: 0.4 mg/dL (ref 0.3–1.2)
Total Protein: 6.8 g/dL (ref 6.5–8.1)

## 2022-11-18 LAB — BRAIN NATRIURETIC PEPTIDE: B Natriuretic Peptide: 23.5 pg/mL (ref 0.0–100.0)

## 2022-11-18 LAB — RESP PANEL BY RT-PCR (RSV, FLU A&B, COVID)  RVPGX2
Influenza A by PCR: NEGATIVE
Influenza B by PCR: NEGATIVE
Resp Syncytial Virus by PCR: NEGATIVE
SARS Coronavirus 2 by RT PCR: NEGATIVE

## 2022-11-18 LAB — TROPONIN I (HIGH SENSITIVITY)
Troponin I (High Sensitivity): 4 ng/L (ref <18)
Troponin I (High Sensitivity): 5 ng/L (ref <18)

## 2022-11-18 LAB — LIPASE, BLOOD: Lipase: 33 U/L (ref 11–51)

## 2022-11-18 MED ORDER — IOHEXOL 350 MG/ML SOLN
100.0000 mL | Freq: Once | INTRAVENOUS | Status: AC | PRN
  Administered 2022-11-18: 100 mL via INTRAVENOUS

## 2022-11-18 MED ORDER — APIXABAN (ELIQUIS) VTE STARTER PACK (10MG AND 5MG): ORAL_TABLET | ORAL | 0 refills | Status: DC

## 2022-11-18 MED ORDER — FENTANYL CITRATE PF 50 MCG/ML IJ SOSY
50.0000 ug | PREFILLED_SYRINGE | Freq: Once | INTRAMUSCULAR | Status: AC
  Administered 2022-11-18: 50 ug via INTRAVENOUS
  Filled 2022-11-18: qty 1

## 2022-11-18 MED ORDER — ONDANSETRON HCL 4 MG/2ML IJ SOLN
4.0000 mg | Freq: Once | INTRAMUSCULAR | Status: AC
  Administered 2022-11-18: 4 mg via INTRAVENOUS
  Filled 2022-11-18: qty 2

## 2022-11-18 NOTE — Discharge Instructions (Signed)
Your history, exam, workup today revealed you have chronic blood clot in your left leg but does not seem to be worse.  There is no clot in your lungs or other new problem in your abdomen or pelvis.  Please follow-up with your primary doctor for the enlarged prostate.  I spoke to interventional radiology who think she may be a candidate for intervention in the left leg vessels.  They should call you in the next several days to discuss a plan.  Please start the Eliquis again and rest and stay hydrated.  If any symptoms change or worsen acutely, please return to the nearest emergency department.

## 2022-11-18 NOTE — Progress Notes (Signed)
Left lower ext venous  has been completed. Refer to [facility] under chart review to view preliminary results.   11/18/2022  10:54 AM Maylie Ashton, Gerarda Gunther

## 2022-11-18 NOTE — ED Triage Notes (Signed)
TRIAGE NOTE:  Patient arrives ambulatory with c/o left leg swelling.  Patient reports has history of clots, patient reports has been out of eliquis for months.

## 2022-11-18 NOTE — ED Provider Notes (Signed)
[place] EMERGENCY DEPARTMENT AT [facility] Provider Note   CSN: [id number] Arrival date & time: 11/18/22  3235     History  Chief Complaint  Patient presents with   Leg Swelling    Andrew Kaiser is a 62 y.o. male.  The history is provided by the patient and medical records. No language interpreter was used.  Chest Pain Pain location:  Substernal area Pain quality: aching, sharp and tightness   Pain radiates to:  Epigastrium Pain severity:  Severe Onset quality:  Gradual Duration:  3 weeks Timing:  Sporadic Progression:  Waxing and waning Chronicity:  Recurrent Context: not trauma   Relieved by:  Nothing Worsened by:  Deep breathing and coughing Ineffective treatments:  None tried Associated symptoms: abdominal pain, cough, lower extremity edema, nausea and shortness of breath   Associated symptoms: no altered mental status, no back pain, no fatigue, no fever, no headache, no palpitations and no vomiting   Risk factors: prior DVT/PE        Home Medications Prior to Admission medications   Medication Sig Start Date End Date Taking? Authorizing Provider  apixaban (ELIQUIS) 5 MG TABS tablet Take 1 tablet (5 mg total) by mouth 2 (two) times daily. 03/05/22   Victorino Sparrow, MD  hydrOXYzine (ATARAX) 25 MG tablet Take 25-50 mg by mouth See admin instructions. Take 25mg  by mouth three times a day. Take 50mg  daily at bedtime as needed for sleep or anxiety. 12/31/21   [provider]  mirtazapine (REMERON) 15 MG tablet TAKE 1 TABLET BY MOUTH AT BEDTIME Patient taking differently: Take 15 mg by mouth at bedtime. 04/03/20 02/13/22    Oxcarbazepine (TRILEPTAL) 300 MG tablet Take 300 mg by mouth 2 (two) times daily.    [provider]  risperiDONE (RISPERDAL) 2 MG tablet Take 2 mg by mouth at bedtime. 12/31/21   [provider]  rosuvastatin (CRESTOR) 10 MG tablet Take 1 tablet (10 mg total) by mouth daily. 02/15/22   Shelby Mattocks, DO       Allergies    Patient has no known allergies.    Review of Systems   Review of Systems  Constitutional:  Negative for chills, fatigue and fever.  HENT:  Negative for congestion.   Respiratory:  Positive for cough, chest tightness and shortness of breath. Negative for wheezing.   Cardiovascular:  Positive for chest pain and leg swelling. Negative for palpitations.  Gastrointestinal:  Positive for abdominal pain and nausea. Negative for constipation, diarrhea and vomiting.  Genitourinary:  Negative for dysuria and flank pain.  Musculoskeletal:  Negative for back pain.  Skin:  Negative for rash and wound.  Neurological:  Negative for light-headedness and headaches.  Psychiatric/Behavioral:  Negative for agitation and confusion.   All other systems reviewed and are negative.   Physical Exam Updated Vital Signs BP 129/84 (BP Location: Right Arm)   Temp 98.7 F (37.1 C) (Oral)   Resp 16   Ht 6' (1.829 m)   Wt 93.9 kg   SpO2 96%   BMI 28.07 kg/m  Physical Exam Vitals and nursing note reviewed.  Constitutional:      General: He is not in acute distress.    Appearance: He is well-developed. He is not ill-appearing, toxic-appearing or diaphoretic.  HENT:     Head: Normocephalic and atraumatic.     Nose: No congestion or rhinorrhea.     Mouth/Throat:     Mouth: Mucous membranes are moist.  Pharynx: No oropharyngeal exudate.  Eyes:     Conjunctiva/sclera: Conjunctivae normal.     Pupils: Pupils are equal, round, and reactive to light.  Cardiovascular:     Rate and Rhythm: Normal rate and regular rhythm.     Pulses: Normal pulses.     Heart sounds: No murmur heard. Pulmonary:     Effort: Pulmonary effort is normal. No respiratory distress.     Breath sounds: Normal breath sounds. No wheezing, rhonchi or rales.  Chest:     Chest wall: No tenderness.  Abdominal:     General: Abdomen is flat.     Palpations: Abdomen is soft.     Tenderness: There is abdominal tenderness.  There is no right CVA tenderness, left CVA tenderness, guarding or rebound.  Musculoskeletal:        General: No swelling or tenderness.     Cervical back: Neck supple. No tenderness.     Right lower leg: No edema.     Left lower leg: Edema present.  Skin:    General: Skin is warm and dry.     Capillary Refill: Capillary refill takes less than 2 seconds.     Findings: Erythema present. No rash.  Neurological:     Mental Status: He is alert.     Sensory: No sensory deficit.     Motor: No weakness.  Psychiatric:        Mood and Affect: Mood normal.     ED Results / Procedures / Treatments   Labs (all labs ordered are listed, but only abnormal results are displayed) Labs Reviewed  CBC WITH DIFFERENTIAL/PLATELET - Abnormal; Notable for the following components:      Result Value   RBC 3.85 (*)    Hemoglobin 11.5 (*)    HCT 35.2 (*)    All other components within normal limits  COMPREHENSIVE METABOLIC PANEL - Abnormal; Notable for the following components:   Calcium 8.5 (*)    Albumin 3.2 (*)    All other components within normal limits  RESP PANEL BY RT-PCR (RSV, FLU A&B, COVID)  RVPGX2  LACTIC ACID, PLASMA  BRAIN NATRIURETIC PEPTIDE  LIPASE, BLOOD  LACTIC ACID, PLASMA  TROPONIN I (HIGH SENSITIVITY)  TROPONIN I (HIGH SENSITIVITY)    EKG EKG Interpretation Date/Time:  Thursday November 18 2022 09:04:49 EDT Ventricular Rate:  84 PR Interval:  147 QRS Duration:  98 QT Interval:  378 QTC Calculation: 447 R Axis:   8  Text Interpretation: Sinus rhythm Borderline T abnormalities, inferior leads when compared to prior, more artifact but otherwise similar. No STEMI Confirmed by Theda Belfast ([id number]) on 11/18/2022 9:06:17 AM  Radiology CT Angio Chest PE W and/or Wo Contrast  Result Date: 11/18/2022 CLINICAL DATA:  Pleuritic chest pain.  Leg pain. EXAM: CT ANGIOGRAPHY CHEST WITH CONTRAST TECHNIQUE: Multidetector CT imaging of the chest was performed using the standard  protocol during bolus administration of intravenous contrast. Multiplanar CT image reconstructions and MIPs were obtained to evaluate the vascular anatomy. RADIATION DOSE REDUCTION: This exam was performed according to the departmental dose-optimization program which includes automated exposure control, adjustment of the mA and/or kV according to patient size and/or use of iterative reconstruction technique. CONTRAST:  OMNIPAQUE IOHEXOL 350 MG/ML SOLN COMPARISON:  CT abdomen and pelvis 11/18/2022 chest CTA 04/04/2022 FINDINGS: Cardiovascular: Main and lobar pulmonary arteries are patent without filling defects. Limited evaluation of the segmental and more distal pulmonary arteries due to poor opacification of the pulmonary arteries. No evidence for  large or central pulmonary embolism. Normal caliber of the thoracic aorta. Heart size is normal. No significant pericardial effusion. Evidence for an interatrial occlusion device. Mediastinum/Nodes: Esophagus is unremarkable. No significant mediastinal or hilar lymph node enlargement. No axillary lymph node enlargement. Lungs/Pleura: Trachea and mainstem bronchi are patent. No pleural effusions. Mild dependent densities in lungs. No airspace disease or consolidation. Upper Abdomen: Gaseous distension in the visualized colon. Musculoskeletal: No acute bone abnormality. Review of the MIP images confirms the above findings. IMPRESSION: 1. No evidence for large or central pulmonary embolism. Limited evaluation of the segmental and more distal pulmonary arteries due to poor opacification of the pulmonary arteries. 2. No acute chest abnormality. 3. Gaseous distension in the visualized colon. Electronically Signed   By: Richarda Overlie M.D.   On: 11/18/2022 13:37   CT VENOGRAM ABDOMEN PELVIS  Addendum Date: 11/18/2022   ADDENDUM REPORT: 11/18/2022 13:36 ADDENDUM: These results were called by telephone at the time of interpretation on 11/18/2022 at 1:36 pm to provider  [address] [employer] , who verbally acknowledged these results. Electronically Signed   By: Marliss Coots M.D.   On: 11/18/2022 13:36   Result Date: 11/18/2022 CLINICAL DATA:  62 year old male, evaluate for deep vein thrombosis extension. EXAM: CT VENOGRAM ABD-PELVIS TECHNIQUE: Multidetector CT imaging of the abdomen and pelvis was performed using the standard protocol after bolus administration of intravenous contrast. Delayed imaging at 90 seconds was also obtained. multiplanar reconstructed images and MIPs were obtained and reviewed to evaluate the vascular anatomy. CONTRAST:  OMNIPAQUE IOHEXOL 350 MG/ML SOLN COMPARISON:  02/13/2022 FINDINGS: VASCULAR IVC: Normal caliber and patent.  No variant anatomy. Renal veins: Patent bilaterally. 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 Again, there is evidence of extrinsic compression between the right common iliac artery and L4-L5 vertebral bodies. The right common, external, and internal iliac veins are widely patent. Femoral veins: The visualized bilateral common and central aspects of the bilateral superficial and profunda veins are patent. Portal system: Normal caliber and patent main and intrahepatic portal veins. The superior mesenteric and splenic veins are patent. Arterial system: Normal caliber abdominal aorta. Review of the MIP images confirms the above findings. NON-VASCULAR Lower chest: Please refer to the dedicated chest CT from the same day for intrathoracic findings. Hepatobiliary: No focal liver abnormality is seen. No gallstones, gallbladder wall thickening, or biliary dilatation. Pancreas: Unremarkable. No pancreatic ductal dilatation or surrounding inflammatory changes. Spleen: Normal in size without focal abnormality. Adrenals/Urinary Tract: Adrenal glands are unremarkable. Similar appearing multifocal bilateral subcentimeter simple renal cysts. Kidneys are otherwise normal, without renal calculi, focal lesion,  or hydronephrosis. Bladder is unremarkable. Stomach/Bowel: Stomach is within normal limits. Appendix appears normal. There is mild dilation of the transverse colon, similar to comparison. No additional evidence of bowel wall thickening, distention, or inflammatory changes. Lymphatic: No abdominopelvic lymphadenopathy. Reproductive: The prostate measures up to 5.2 cm in maximum axial dimension with mild median lobe bulge upon the base of the bladder. Other: No abdominal wall hernia or abnormality. No abdominopelvic ascites. Musculoskeletal: No acute or significant osseous findings. 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 No evidence of acute thrombus. NON-VASCULAR 1. No acute abdominopelvic abnormality. 2. Mild prostatomegaly. Consider referral to Interventional Radiology for chronic venous occlusion recanalization. Marliss Coots, MD [facility] Radiology Electronically Signed: By: Marliss Coots M.D. On: 11/18/2022 13:29   VAS [place] LOWER EXTREMITY VENOUS (DVT) (ONLY [facility] &  [facility])  Result Date: 11/18/2022  Lower Venous DVT Study Patient Name:  Andrew Kaiser  Date of Exam:   11/18/2022 Medical Rec #: [id number]         Accession #:    [id number] Date of Birth: 1960-06-13         Patient Gender: M Patient Age:   89 years Exam Location:  [facility] Procedure:      VAS [place] LOWER EXTREMITY VENOUS (DVT) Referring Phys: Lynden Oxford --------------------------------------------------------------------------------  Indications: Swelling, and History of extensive DVT in left leg on 04/14/22. He has been out of Eliquis for three months. Other Indications: HIstory of CVA x 3. Comparison Study: 04/14/22 - Extensive DVT in left leg. Performing Technologist: Marilynne Halsted RDMS, RVT  Examination Guidelines: A complete evaluation includes B-mode imaging, spectral Doppler, color Doppler, and power  Doppler as needed of all accessible portions of each vessel. Bilateral testing is considered an integral part of a complete examination. Limited examinations for reoccurring indications may be performed as noted. The reflux portion of the exam is performed with the patient in reverse Trendelenburg.  +-----+---------------+---------+-----------+----------+--------------+ RIGHTCompressibilityPhasicitySpontaneityPropertiesThrombus Aging +-----+---------------+---------+-----------+----------+--------------+ CFV  Full           Yes      Yes                                 +-----+---------------+---------+-----------+----------+--------------+ SFJ  Full                                                        +-----+---------------+---------+-----------+----------+--------------+   +---------+---------------+---------+-----------+----------+-------------------+ LEFT     CompressibilityPhasicitySpontaneityPropertiesThrombus Aging      +---------+---------------+---------+-----------+----------+-------------------+ CFV      Partial        No       No                   Chronic             +---------+---------------+---------+-----------+----------+-------------------+ SFJ      Full                                                             +---------+---------------+---------+-----------+----------+-------------------+ FV Prox  Partial        No       No                   Chronic             +---------+---------------+---------+-----------+----------+-------------------+ FV Mid   Partial                                      Chronic             +---------+---------------+---------+-----------+----------+-------------------+ FV DistalPartial                                                          +---------+---------------+---------+-----------+----------+-------------------+  PFV      Full                                                              +---------+---------------+---------+-----------+----------+-------------------+ POP      Partial        No       No                   Chronic             +---------+---------------+---------+-----------+----------+-------------------+ PTV      None                                                             +---------+---------------+---------+-----------+----------+-------------------+ PERO     None                                                             +---------+---------------+---------+-----------+----------+-------------------+ EIV                                                   Not well visualized +---------+---------------+---------+-----------+----------+-------------------+    Summary: RIGHT: - No evidence of common femoral vein obstruction.  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   *See table(s) above for measurements and observations.    Preliminary     Procedures Procedures    Medications Ordered in ED Medications  fentaNYL (SUBLIMAZE) injection 50 mcg (50 mcg Intravenous Given 11/18/22 1003)  ondansetron (ZOFRAN) injection 4 mg (4 mg Intravenous Given 11/18/22 1002)  iohexol (OMNIPAQUE) 350 MG/ML injection 100 mL (100 mLs Intravenous Contrast Given 11/18/22 1151)    ED Course/ Medical Decision Making/ A&P                                 Medical Decision Making Amount and/or Complexity of Data Reviewed Labs: ordered. Radiology: ordered.  Risk Prescription drug management.    ZYEN ACKER is a 62 y.o. male with a past medical history significant for previous DVT, previous pulmonary embolism, bipolar disorder, stroke and PFO, anxiety, depression who presents with 3 weeks of left leg pain, swelling extending to his groin, abdominal pain, and pleuritic chest pain and shortness of breath.   According to patient, he is concerned he has a blood clot.  He reports he has been off of his Eliquis for about 3 months due to issues about he was going to prescribe him the medications and cost.  He says that he is worried he has blood clots again all over.  He has left leg pain and swelling and redness  and denies any skin injuries or cellulitis.  Denies any trauma.  Reports the pain is in his calf going up towards his left upper thigh.  He reports he is also having some abdominal ache and discomfort and some nausea.  No vomiting constipation or diarrhea reported.  No groin or penis pains reported.  He denies pain in his back but does have pleuritic pain across his chest with shortness of breath.  He describes his up to 10 out of 10 at times.  He reports no other complaints today.  He does report some cough and possible sick contacts and would like to be checked for COVID.  On exam, lungs clear.  Chest was nontender.  Abdomen was slightly tender.  Bowel sounds were appreciated.  Left leg is significantly more swollen than his right leg.  He does have intact pulses however.  Good pulses in upper extremities.  No focal neurologic deficits on initial evaluation.  Pupils symmetric.  Clear speech.  Symmetric smile.  Patient otherwise well-appearing.    EKG showed no STEMI.  Clinically I am concerned about recurrent DVT given missing Eliquis.  Him concerned about being in his leg, possibly in his abdomen, and in his lungs.  Will get CT of the chest for PE study, will get a CTV of his abdomen, and will get ultrasound of his leg.  We given some pain medicine, nausea medicine, and will also get a cardiac workup.  Anticipate reassessment after workup to determine disposition.   2:16 PM Workup continues to return.  Ultrasound shows persistent clot in the leg and CT imaging does not show evidence of new pulm embolism or new problem in his abdomen or pelvis.  Does show some prostatic megaly which she will follow-up  with his primary doctor about.  I spoke to radiology with Dr. Elby Showers who thinks he could be a candidate for IR intervention for this left leg.  He will have his team call the patient to schedule appointment in the next week or so to talk about management.  He does on the patient back on Eliquis we will print a prescription for Eliquis for him.  Patient agrees with this plan and follow-up with the interventional radiology team.  He understood return precautions and follow-up instructions and was discharged in good condition.          Final Clinical Impression(s) / ED Diagnoses Final diagnoses:  Lower leg DVT (deep venous thromboembolism), chronic, left (HCC)  Left leg pain  Left leg swelling    Rx / DC Orders ED Discharge Orders          Ordered    APIXABAN (ELIQUIS) VTE STARTER PACK (10MG  AND 5MG )       Note to Pharmacy: If starter pack unavailable, substitute with seventy-four 5 mg apixaban tabs following the above SIG directions.   11/18/22 1419            Clinical Impression: 1. Lower leg DVT (deep venous thromboembolism), chronic, left (HCC)   2. Left leg pain   3. Left leg swelling     Disposition: Discharge  Condition: Good  I have discussed the results, Dx and Tx plan with the pt(& family if present). He/she/they expressed understanding and agree(s) with the plan. Discharge instructions discussed at great length. Strict return precautions discussed and pt &/or family have verbalized understanding of the instructions. No further questions at time of discharge.    Discharge Medication List as of 11/18/2022  2:20 PM  START taking these medications   Details  APIXABAN (ELIQUIS) VTE STARTER PACK (10MG  AND 5MG ) Take as directed on package: start with two-5mg  tablets twice daily for 7 days. On day 8, switch to one-5mg  tablet twice daily., Print        Follow Up: interventioanl radiology     Inc, [facility] [address]  [place] [address] [phone]         Adel Neyer, Canary Brim, MD 11/18/22 1640

## 2022-11-25 ENCOUNTER — Telehealth: Payer: Self-pay | Admitting: *Deleted

## 2022-11-25 NOTE — Telephone Encounter (Signed)
Concern:  DVT  Location: left leg  Description: Pt went to ED on 11/19/22 in regards to  recurrent DVT.  Called about about surgery .Stopped taking his eliquis for about 2 months.   Treatments:  pt written new eliquis order   and ED states in there notes that  "Dr. Elby Showers who thinks he could be a candidate for IR intervention for this left leg. He will have his team call the patient to schedule appointment in the next week or so to talk about management."  Resolution: informed pt to call Dr.suttle office and to call back if any further questions

## 2022-11-30 NOTE — Progress Notes (Signed)
Chief Complaint: Patient was seen in consultation today for venous occlusion  Referring Physician(s): Tegeler,Christopher J  History of Present Illness: Andrew Kaiser is a 62 y.o. male with a past medical history significant for DVT, pulmonary embolism, bipolar disorder, stroke, PFO, anxiety and depression. He presented to the ED 11/18/22 with complaints of left leg pain and swelling extending to his groin. He also complained of abdominal pain, pleuritic chest pain and shortness of breath. He had not taken his Eliquis for approximately 3 months due to cost and other issues related to obtaining this medication.   Work up in the ED was positive for a persistent clot in the leg and I was contacted by the ED physician and asked to evaluate this patient. 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   He was discharged from the ED with a prescription for Eliquis and outpatient follow-up with me to discuss treatment options.   He presents today to discuss possible recanalization of the occluded vessels.   Past Medical History:  Diagnosis Date   Allergy    Anemia    Anxiety    Arthritis    knees    Bipolar affective (HCC)    Depression    Memory disturbance 10/07/2014   PFO (patent foramen ovale)    Stroke [facility])    x3    Past Surgical History:  Procedure Laterality Date   COLONOSCOPY     IVC VENOGRAPHY N/A 04/14/2022   Procedure: IVC Venography;  Surgeon: Victorino Sparrow, MD;  Location: [facility] INVASIVE CV LAB;  Service: Cardiovascular;  Laterality: N/A;   LOWER EXTREMITY VENOGRAPHY Left 04/14/2022   Procedure: LOWER EXTREMITY VENOGRAPHY;  Surgeon: Victorino Sparrow, MD;  Location: [facility] INVASIVE CV LAB;  Service: Cardiovascular;  Laterality: Left;   PATENT FORAMEN OVALE CLOSURE  2006   PERIPHERAL VASCULAR BALLOON ANGIOPLASTY Left 02/17/2022   Procedure: PERIPHERAL VASCULAR BALLOON  ANGIOPLASTY;  Surgeon: Victorino Sparrow, MD;  Location: [facility] INVASIVE CV LAB;  Service: Cardiovascular;  Laterality: Left;   PERIPHERAL VASCULAR THROMBECTOMY N/A 02/17/2022   Procedure: VENOUS THROMBECTOMY;  Surgeon: Victorino Sparrow, MD;  Location: [facility] INVASIVE CV LAB;  Service: Cardiovascular;  Laterality: N/A;   POLYPECTOMY     RHINOPLASTY      Allergies: Patient has no known allergies.  Medications: Prior to Admission medications   Medication Sig Start Date End Date Taking? Authorizing Provider  apixaban (ELIQUIS) 5 MG TABS tablet Take 1 tablet (5 mg total) by mouth 2 (two) times daily. 03/05/22   Victorino Sparrow, MD  APIXABAN Everlene Balls) VTE STARTER PACK (10MG  AND 5MG ) Take as directed on package: start with two-5mg  tablets twice daily for 7 days. On day 8, switch to one-5mg  tablet twice daily. 11/18/22   Tegeler, Canary Brim, MD  hydrOXYzine (ATARAX) 25 MG tablet Take 25-50 mg by mouth See admin instructions. Take 25mg  by mouth three times a day. Take 50mg  daily at bedtime as needed for sleep or anxiety. 12/31/21   [provider]  mirtazapine (REMERON) 15 MG tablet TAKE 1 TABLET BY MOUTH AT BEDTIME Patient taking differently: Take 15 mg by mouth at bedtime. 04/03/20 02/13/22    Oxcarbazepine (TRILEPTAL) 300 MG tablet Take 300 mg by mouth 2 (two) times daily.    [provider]  risperiDONE (RISPERDAL) 2 MG tablet Take 2 mg by mouth at bedtime. 12/31/21   [provider]  rosuvastatin (  CRESTOR) 10 MG tablet Take 1 tablet (10 mg total) by mouth daily. 02/15/22   Shelby Mattocks, DO     Family History  Problem Relation Age of Onset   Stroke Mother    Heart disease Father    Asthma Father    Stroke Sister    Multiple sclerosis Sister    Colon cancer Neg Hx    Pancreatic cancer Neg Hx    Rectal cancer Neg Hx    Stomach cancer Neg Hx    Esophageal cancer Neg Hx    Prostate cancer Neg Hx    Colon polyps Neg Hx     Social History   Socioeconomic History    Marital status: Divorced    Spouse name: Not on file   Number of children: 1   Years of education: 15   Highest education level: Not on file  Occupational History   Occupation: unemployed  Tobacco Use   Smoking status: Some Days    Types: Cigars    Passive exposure: Current   Smokeless tobacco: Never  Vaping Use   Vaping status: Never Used  Substance and Sexual Activity   Alcohol use: Yes    Alcohol/week: 0.0 standard drinks of alcohol    Comment: socially   Drug use: No   Sexual activity: Not on file  Other Topics Concern   Not on file  Social History Narrative   Patient drinks caffeine occasionally.   Patient is left handed.   Social Determinants of Health   Financial Resource Strain: High Risk (11/24/2021)   Overall Financial Resource Strain (CARDIA)    Difficulty of Paying Living Expenses: Hard  Food Insecurity: Not on file (11/19/2022)  Transportation Needs: Unmet Transportation Needs (02/18/2022)   PRAPARE - Transportation    Lack of Transportation (Medical): Yes    Lack of Transportation (Non-Medical): Yes  Physical Activity: Sufficiently Active (11/24/2021)   Exercise Vital Sign    Days of Exercise per Week: 7 days    Minutes of Exercise per Session: 30 min  Stress: Stress Concern Present (11/24/2021)   [employer] of Occupational Health - Occupational Stress Questionnaire    Feeling of Stress : To some extent  Social Connections: Socially Isolated (11/24/2021)   Social Connection and Isolation Panel [NHANES]    Frequency of Communication with Friends and Family: More than three times a week    Frequency of Social Gatherings with Friends and Family: More than three times a week    Attends Religious Services: Never    Database administrator or Organizations: No    Attends Banker Meetings: Never    Marital Status: Divorced    Review of Systems: A 12 point ROS discussed and pertinent positives are indicated in the HPI above.  All other systems  are negative.  Review of Systems  Vital Signs: There were no vitals taken for this visit.  Physical Exam  Imaging: VAS [place] LOWER EXTREMITY VENOUS (DVT) (ONLY [facility] & [facility])  Result Date: 11/19/2022  Lower Venous DVT Study Patient Name:  Andrew Kaiser  Date of Exam:   11/18/2022 Medical Rec #: [id number]         Accession #:    [id number] Date of Birth: 1960/09/13         Patient Gender: M Patient Age:   28 years Exam Location:  [facility] Procedure:      VAS [place] LOWER EXTREMITY VENOUS (DVT) Referring Phys: Lynden Oxford --------------------------------------------------------------------------------  Indications: Swelling, and History  of extensive DVT in left leg on 04/14/22. He has been out of Eliquis for three months. Other Indications: HIstory of CVA x 3. Comparison Study: 04/14/22 - Extensive DVT in left leg. Performing Technologist: Marilynne Halsted RDMS, RVT  Examination Guidelines: A complete evaluation includes B-mode imaging, spectral Doppler, color Doppler, and power Doppler as needed of all accessible portions of each vessel. Bilateral testing is considered an integral part of a complete examination. Limited examinations for reoccurring indications may be performed as noted. The reflux portion of the exam is performed with the patient in reverse Trendelenburg.  +-----+---------------+---------+-----------+----------+--------------+ RIGHTCompressibilityPhasicitySpontaneityPropertiesThrombus Aging +-----+---------------+---------+-----------+----------+--------------+ CFV  Full           Yes      Yes                                 +-----+---------------+---------+-----------+----------+--------------+ SFJ  Full                                                        +-----+---------------+---------+-----------+----------+--------------+   +---------+---------------+---------+-----------+----------+-------------------+ LEFT      CompressibilityPhasicitySpontaneityPropertiesThrombus Aging      +---------+---------------+---------+-----------+----------+-------------------+ CFV      Partial        No       No                   Chronic             +---------+---------------+---------+-----------+----------+-------------------+ SFJ      Full                                                             +---------+---------------+---------+-----------+----------+-------------------+ FV Prox  Partial        No       No                   Chronic             +---------+---------------+---------+-----------+----------+-------------------+ FV Mid   Partial                                      Chronic             +---------+---------------+---------+-----------+----------+-------------------+ FV DistalPartial                                                          +---------+---------------+---------+-----------+----------+-------------------+ PFV      Full                                                             +---------+---------------+---------+-----------+----------+-------------------+ POP      Partial  No       No                   Chronic             +---------+---------------+---------+-----------+----------+-------------------+ PTV      None                                                             +---------+---------------+---------+-----------+----------+-------------------+ PERO     None                                                             +---------+---------------+---------+-----------+----------+-------------------+ EIV                                                   Not well visualized +---------+---------------+---------+-----------+----------+-------------------+     Summary: RIGHT: - No evidence of common femoral vein obstruction.  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   *See table(s) above for measurements and observations. Electronically signed by Gerarda Fraction on 11/19/2022 at 8:41:43 AM.    Final    CT Angio Chest PE W and/or Wo Contrast  Result Date: 11/18/2022 CLINICAL DATA:  Pleuritic chest pain.  Leg pain. EXAM: CT ANGIOGRAPHY CHEST WITH CONTRAST TECHNIQUE: Multidetector CT imaging of the chest was performed using the standard protocol during bolus administration of intravenous contrast. Multiplanar CT image reconstructions and MIPs were obtained to evaluate the vascular anatomy. RADIATION DOSE REDUCTION: This exam was performed according to the departmental dose-optimization program which includes automated exposure control, adjustment of the mA and/or kV according to patient size and/or use of iterative reconstruction technique. CONTRAST:  OMNIPAQUE IOHEXOL 350 MG/ML SOLN COMPARISON:  CT abdomen and pelvis 11/18/2022 chest CTA 04/04/2022 FINDINGS: Cardiovascular: Main and lobar pulmonary arteries are patent without filling defects. Limited evaluation of the segmental and more distal pulmonary arteries due to poor opacification of the pulmonary arteries. No evidence for large or central pulmonary embolism. Normal caliber of the thoracic aorta. Heart size is normal. No significant pericardial effusion. Evidence for an interatrial occlusion device. Mediastinum/Nodes: Esophagus is unremarkable. No significant mediastinal or hilar lymph node enlargement. No axillary lymph node enlargement. Lungs/Pleura: Trachea and mainstem bronchi are patent. No pleural effusions. Mild dependent densities in lungs. No airspace disease or consolidation. Upper Abdomen: Gaseous distension in the visualized colon. Musculoskeletal: No acute bone abnormality. Review of the MIP images confirms the above findings. IMPRESSION: 1. No evidence for large or central pulmonary embolism. Limited evaluation of  the segmental and more distal pulmonary arteries due to poor opacification of the pulmonary arteries. 2. No acute chest abnormality. 3. Gaseous distension in the visualized colon. Electronically Signed   By: Richarda Overlie M.D.   On: 11/18/2022 13:37   CT VENOGRAM ABDOMEN PELVIS  Addendum Date: 11/18/2022   ADDENDUM REPORT: 11/18/2022 13:36  ADDENDUM: These results were called by telephone at the time of interpretation on 11/18/2022 at 1:36 pm to provider [facility] , who verbally acknowledged these results. Electronically Signed   By: Marliss Coots M.D.   On: 11/18/2022 13:36   Result Date: 11/18/2022 CLINICAL DATA:  62 year old male, evaluate for deep vein thrombosis extension. EXAM: CT VENOGRAM ABD-PELVIS TECHNIQUE: Multidetector CT imaging of the abdomen and pelvis was performed using the standard protocol after bolus administration of intravenous contrast. Delayed imaging at 90 seconds was also obtained. multiplanar reconstructed images and MIPs were obtained and reviewed to evaluate the vascular anatomy. CONTRAST:  OMNIPAQUE IOHEXOL 350 MG/ML SOLN COMPARISON:  02/13/2022 FINDINGS: VASCULAR IVC: Normal caliber and patent.  No variant anatomy. Renal veins: Patent bilaterally. 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 Again, there is evidence of extrinsic compression between the right common iliac artery and L4-L5 vertebral bodies. The right common, external, and internal iliac veins are widely patent. Femoral veins: The visualized bilateral common and central aspects of the bilateral superficial and profunda veins are patent. Portal system: Normal caliber and patent main and intrahepatic portal veins. The superior mesenteric and splenic veins are patent. Arterial system: Normal caliber abdominal aorta. Review of the MIP images confirms the above findings. NON-VASCULAR Lower chest: Please refer to the dedicated chest CT from the same day for intrathoracic findings.  Hepatobiliary: No focal liver abnormality is seen. No gallstones, gallbladder wall thickening, or biliary dilatation. Pancreas: Unremarkable. No pancreatic ductal dilatation or surrounding inflammatory changes. Spleen: Normal in size without focal abnormality. Adrenals/Urinary Tract: Adrenal glands are unremarkable. Similar appearing multifocal bilateral subcentimeter simple renal cysts. Kidneys are otherwise normal, without renal calculi, focal lesion, or hydronephrosis. Bladder is unremarkable. Stomach/Bowel: Stomach is within normal limits. Appendix appears normal. There is mild dilation of the transverse colon, similar to comparison. No additional evidence of bowel wall thickening, distention, or inflammatory changes. Lymphatic: No abdominopelvic lymphadenopathy. Reproductive: The prostate measures up to 5.2 cm in maximum axial dimension with mild median lobe bulge upon the base of the bladder. Other: No abdominal wall hernia or abnormality. No abdominopelvic ascites. Musculoskeletal: No acute or significant osseous findings. 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 No evidence of acute thrombus. NON-VASCULAR 1. No acute abdominopelvic abnormality. 2. Mild prostatomegaly. Consider referral to Interventional Radiology for chronic venous occlusion recanalization. Marliss Coots, MD [facility] Electronically Signed: By: Marliss Coots M.D. On: 11/18/2022 13:29    Labs:  CBC: Recent Labs    02/14/22 0456 02/17/22 1058 02/17/22 1616 04/04/22 1854 04/14/22 0747 11/18/22 0935  WBC 9.8  --  10.0 5.3  --  6.9  HGB 11.9*   < > 10.5* 11.0* 12.2* 11.5*  HCT 34.9*   < > 30.8* 35.1* 36.0* 35.2*  PLT 207  --  332 239  --  182   < > = values in this interval not displayed.    COAGS: No results for input(s): "INR", "APTT" in the last 8760 hours.  BMP: Recent Labs     02/13/22 0149 02/14/22 0456 02/17/22 1058 04/04/22 1854 04/14/22 0747 11/18/22 0935  NA 134* 134* 135 138 139 138  K 3.5 4.0 3.5 4.2 3.8 3.6  CL 97* 96* 99 105 101 103  CO2 24 26  --  26  --  26  GLUCOSE 128* 103* 106* 103* 88 96  BUN 15 14 15  8  12 15  CALCIUM 9.5 8.9  --  8.7*  --  8.5*  CREATININE 1.18 1.10 1.00 1.15 1.00 1.05  GFRNONAA >60 >60  --  >60  --  >60    LIVER FUNCTION TESTS: Recent Labs    02/13/22 0149 02/14/22 0456 11/18/22 0935  BILITOT 2.2* 1.0 0.4  AST 39 50* 17  ALT 46* 59* 15  ALKPHOS 62 55 39  PROT 8.1 7.4 6.8  ALBUMIN 3.5 2.9* 3.2*    TUMOR MARKERS: No results for input(s): "AFPTM", "CEA", "CA199", "CHROMGRNA" in the last 8760 hours.  Assessment and Plan:  62 year old male with a history of venous occlusion secondary to May-Thurner Syndrome.   Thank you for this interesting consult.  I greatly enjoyed meeting Andrew Kaiser and look forward to participating in their care.  A copy of this report was sent to the requesting provider on this date.  Electronically Signed: Mickie Kay, NP 11/30/2022, 10:55 AM   I spent a total of 40 Minutes    in face to face in clinical consultation, greater than 50% of which was counseling/coordinating care for chronic venous occlusion.

## 2022-12-01 ENCOUNTER — Ambulatory Visit
Admission: RE | Admit: 2022-12-01 | Discharge: 2022-12-01 | Disposition: A | Discharge status: Home / Self Care | Payer: MEDICAID | Source: Ambulatory Visit | Attending: Emergency Medicine

## 2022-12-01 DIAGNOSIS — I745 Embolism and thrombosis of iliac artery: Secondary | ICD-10-CM

## 2022-12-01 HISTORY — PX: IR RADIOLOGIST EVAL & MGMT: IMG5224

## 2022-12-03 ENCOUNTER — Emergency Department (HOSPITAL_COMMUNITY)
Admission: EM | Admit: 2022-12-03 | Discharge: 2022-12-03 | Disposition: A | Discharge status: Home / Self Care | Payer: MEDICAID | Attending: Emergency Medicine | Admitting: Emergency Medicine

## 2022-12-03 ENCOUNTER — Encounter (HOSPITAL_COMMUNITY): Payer: Self-pay

## 2022-12-03 ENCOUNTER — Other Ambulatory Visit: Payer: Self-pay

## 2022-12-03 DIAGNOSIS — G4700 Insomnia, unspecified: Secondary | ICD-10-CM | POA: Insufficient documentation

## 2022-12-03 DIAGNOSIS — Z7901 Long term (current) use of anticoagulants: Secondary | ICD-10-CM | POA: Insufficient documentation

## 2022-12-03 MED ORDER — ZOLPIDEM TARTRATE 5 MG PO TABS: 5.0000 mg | ORAL_TABLET | Freq: Every evening | ORAL | 0 refills | Status: DC | PRN

## 2022-12-03 NOTE — ED Notes (Signed)
Pt showed back up in the lobby

## 2022-12-03 NOTE — Discharge Instructions (Addendum)
I have refilled your Ambien as needed.  As we discussed, if you need refill of your respite all and other psych meds, you need to talk to your psychiatrist or follow-up with behavioral health urgent care.  Return to ER if you have thoughts of harming yourself or others or hallucinations or trouble sleeping

## 2022-12-03 NOTE — ED Provider Notes (Signed)
EMERGENCY DEPARTMENT AT [facility] Provider Note   CSN: [id number] Arrival date & time: 12/03/22  1245     History  Chief Complaint  Patient presents with   Insomnia    Andrew Kaiser is a 62 y.o. male history of bipolar, recent DVT, here presenting with trouble sleeping.  He states that he has some social situations and kicked out of his house.  He states that he has been very tired but has been unable to sleep.  He states that he usually he takes some Ambien at home.  He also takes Risperdal.  Patient states that he ran out of his meds.  He states that he does have some racing thoughts but denies any hallucinations or thoughts of harming himself or others.  Patient initially came for insomnia but then left and came back.  Patient adamantly denies any suicide attempt  The history is provided by the patient.       Home Medications Prior to Admission medications   Medication Sig Start Date End Date Taking? Authorizing Provider  apixaban (ELIQUIS) 5 MG TABS tablet Take 1 tablet (5 mg total) by mouth 2 (two) times daily. 03/05/22   Victorino Sparrow, MD  APIXABAN Everlene Balls) VTE STARTER PACK (10MG  AND 5MG ) Take as directed on package: start with two-5mg  tablets twice daily for 7 days. On day 8, switch to one-5mg  tablet twice daily. 11/18/22   Tegeler, Canary Brim, MD  hydrOXYzine (ATARAX) 25 MG tablet Take 25-50 mg by mouth See admin instructions. Take 25mg  by mouth three times a day. Take 50mg  daily at bedtime as needed for sleep or anxiety. 12/31/21   [provider]  mirtazapine (REMERON) 15 MG tablet TAKE 1 TABLET BY MOUTH AT BEDTIME Patient taking differently: Take 15 mg by mouth at bedtime. 04/03/20 02/13/22    Oxcarbazepine (TRILEPTAL) 300 MG tablet Take 300 mg by mouth 2 (two) times daily.    [provider]  risperiDONE (RISPERDAL) 2 MG tablet Take 2 mg by mouth at bedtime. 12/31/21   [provider]  rosuvastatin (CRESTOR) 10 MG  tablet Take 1 tablet (10 mg total) by mouth daily. 02/15/22   Shelby Mattocks, DO      Allergies    Patient has no known allergies.    Review of Systems   Review of Systems  Psychiatric/Behavioral:  The patient is hyperactive.   All other systems reviewed and are negative.   Physical Exam Updated Vital Signs BP (!) 130/90   Pulse 75   Temp 98.1 F (36.7 C)   Resp 17   Ht 6' (1.829 m)   Wt 93.9 kg   SpO2 99%   BMI 28.07 kg/m  Physical Exam Vitals and nursing note reviewed.  Constitutional:      Appearance: Normal appearance.     Comments: Awake and alert  HENT:     Head: Normocephalic.     Nose: Nose normal.     Mouth/Throat:     Mouth: Mucous membranes are moist.  Eyes:     Extraocular Movements: Extraocular movements intact.     Pupils: Pupils are equal, round, and reactive to light.  Cardiovascular:     Rate and Rhythm: Normal rate and regular rhythm.     Pulses: Normal pulses.     Heart sounds: Normal heart sounds.  Pulmonary:     Effort: Pulmonary effort is normal.     Breath sounds: Normal breath sounds.  Abdominal:     General:  Abdomen is flat.     Palpations: Abdomen is soft.  Musculoskeletal:        General: Normal range of motion.     Cervical back: Normal range of motion and neck supple.  Skin:    General: Skin is warm.     Capillary Refill: Capillary refill takes less than 2 seconds.  Neurological:     General: No focal deficit present.     Mental Status: He is alert and oriented to person, place, and time.  Psychiatric:     Comments: Normal Mood.  Patient has appropriate judgment. Patient is not agitated      ED Results / Procedures / Treatments   Labs (all labs ordered are listed, but only abnormal results are displayed) Labs Reviewed - No data to display  EKG None  Radiology No results found.  Procedures Procedures    Medications Ordered in ED Medications - No data to display  ED Course/ Medical Decision Making/ A&P                                  Medical Decision Making Andrew Kaiser is a 62 y.o. male here with trouble sleeping.  Patient states that he is out of his Ambien.  Patient is bipolar and also is not taking his psych meds.  I offered to have patient stay here and get [place] TTS consult.  He states that he is not suicidal homicidal has no hallucinations.  He does not appear agitated so I think is reasonable he can just follow-up with psychiatry outpatient.  He states that he actually has an appointment later on next month.  Will refill his Ambien and I told him that he needs to follow-up with psychiatry or behavioral health urgent care.   Problems Addressed: Insomnia, unspecified type: acute illness or injury    Final Clinical Impression(s) / ED Diagnoses Final diagnoses:  None    Rx / DC Orders ED Discharge Orders     None         Charlynne Pander, MD 12/03/22 [phone]

## 2022-12-03 NOTE — ED Triage Notes (Signed)
Reports he is bipolar and has been super manic over several weeks getting little sleep.  Patient is requesting medication to sleep.  Denies SI/HI.

## 2022-12-03 NOTE — ED Notes (Signed)
Per MD Silverio Lay, pt does not need to be dressed out at this time.

## 2022-12-06 ENCOUNTER — Other Ambulatory Visit (HOSPITAL_COMMUNITY): Payer: Self-pay | Admitting: Interventional Radiology

## 2022-12-06 DIAGNOSIS — I745 Embolism and thrombosis of iliac artery: Secondary | ICD-10-CM

## 2022-12-15 ENCOUNTER — Other Ambulatory Visit (HOSPITAL_COMMUNITY): Payer: Self-pay | Admitting: Student

## 2022-12-15 DIAGNOSIS — I824Y2 Acute embolism and thrombosis of unspecified deep veins of left proximal lower extremity: Secondary | ICD-10-CM

## 2022-12-15 MED ORDER — ENOXAPARIN SODIUM 100 MG/ML IJ SOSY: 1.0000 mg/kg | PREFILLED_SYRINGE | Freq: Once | INTRAMUSCULAR | Status: DC

## 2022-12-17 ENCOUNTER — Other Ambulatory Visit: Payer: Self-pay | Admitting: Student

## 2022-12-17 ENCOUNTER — Encounter (HOSPITAL_COMMUNITY): Payer: Self-pay | Admitting: Interventional Radiology

## 2022-12-17 ENCOUNTER — Other Ambulatory Visit: Payer: Self-pay

## 2022-12-17 NOTE — Progress Notes (Signed)
Called Dr. Elby Showers for instructions regarding holding or continuing pt's Eliquis. Pt's procedure is Monday.

## 2022-12-17 NOTE — H&P (Shared)
Chief Complaint: Patient was seen in consultation today for left lower extremity DVT  Referring Physician(s): Dr. Rush Landmark   Supervising Physician: Marliss Coots  Patient Status: [facility] - Out-pt  History of Present Illness: Andrew Kaiser is a 62 y.o. male with a past medical history significant for bipolar disorder, stroke, PFO, anxiety, depression, pulmonary embolism and DVT. He was first diagnosed with a left lower extremity DVT (left popliteal vein through left common iliac vein) December 2023 and he underwent thrombectomy/balloon venoplasty with Vascular Surgery. He was discharged on Eliquis.   Follow up imaging in January 2023 showed reocclusion of the left-sided iliac veins, common femoral vein and femoral vein. He was taken back to the OR 04/14/22 by Vascular but they were unable to get a wire into the vessels. He was treated conservatively with anticoagulation, compression and elevation. He established care with Hematology for further work up.   He presented to the ED 11/18/22 with complaints of left leg pain and swelling extending to his groin. He also complained of abdominal pain, pleuritic chest pain and shortness of breath. He had not taken his Eliquis for approximately 3 months due to cost and other issues related to obtaining this medication.   Work up in the ED was positive for a persistent clot in the leg and Dr. Elby Showers was contacted by the ED physician and asked to evaluate this patient. Dr. 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 He was discharged from the ED with a prescription for Eliquis and outpatient follow-up with Dr. Elby Showers to discuss treatment options.   The patient met with Dr. Elby Showers 12/01/22 for evaluation. He endorsed constant LLE swelling and pain that had started December 2023. He also complained of heaviness, pruritus and numbness/tingling in the  left leg. He denied having issues with poor wound healing. He requires a cane for ambulation and wears thigh high compression stockings daily. He stated he had been compliant with Eliquis since his ED visit.   Dr. Elby Showers discussed the risks, benefits, periprocedural expectations of left iliofemoral venous recanalization and possible left iliac vein stent placement. He discussed additional management expectations including medication compliance, follow up with Hematology, and smoking cessation. He was in agreement and wished to proceed.   Past Medical History:  Diagnosis Date   Allergy    Anemia    Anxiety    Arthritis    knees    Bipolar affective (HCC)    Depression    Memory disturbance 10/07/2014   PFO (patent foramen ovale)    Stroke [facility])    x3    Past Surgical History:  Procedure Laterality Date   COLONOSCOPY     IR RADIOLOGIST EVAL & MGMT  12/01/2022   IVC VENOGRAPHY N/A 04/14/2022   Procedure: IVC Venography;  Surgeon: Victorino Sparrow, MD;  Location: [facility] INVASIVE CV LAB;  Service: Cardiovascular;  Laterality: N/A;   LOWER EXTREMITY VENOGRAPHY Left 04/14/2022   Procedure: LOWER EXTREMITY VENOGRAPHY;  Surgeon: Victorino Sparrow, MD;  Location: [facility] INVASIVE CV LAB;  Service: Cardiovascular;  Laterality: Left;   PATENT FORAMEN OVALE CLOSURE  2006   PERIPHERAL VASCULAR BALLOON ANGIOPLASTY Left 02/17/2022   Procedure: PERIPHERAL VASCULAR BALLOON ANGIOPLASTY;  Surgeon: Victorino Sparrow, MD;  Location: [facility] INVASIVE CV LAB;  Service: Cardiovascular;  Laterality: Left;   PERIPHERAL VASCULAR THROMBECTOMY N/A 02/17/2022   Procedure: VENOUS THROMBECTOMY;  Surgeon: Victorino Sparrow, MD;  Location:  [facility] INVASIVE CV LAB;  Service: Cardiovascular;  Laterality: N/A;   POLYPECTOMY     RHINOPLASTY      Allergies: Patient has no known allergies.  Medications: Prior to Admission medications   Medication Sig Start Date End Date Taking? Authorizing Provider  APIXABAN (ELIQUIS) VTE STARTER PACK (10MG   AND 5MG ) Take as directed on package: start with two-5mg  tablets twice daily for 7 days. On day 8, switch to one-5mg  tablet twice daily. 11/18/22  Yes Tegeler, Canary Brim, MD  baclofen (LIORESAL) 10 MG tablet Take 10-20 mg by mouth 2 (two) times daily as needed for muscle spasms. 11/08/22  Yes [provider]  cetirizine (ZYRTEC) 10 MG tablet Take 10 mg by mouth daily as needed for allergies.   Yes [provider]  Multiple Vitamin (MULTIVITAMIN WITH MINERALS) TABS tablet Take 1 tablet by mouth daily.   Yes [provider]  Oxcarbazepine (TRILEPTAL) 300 MG tablet Take 300 mg by mouth 2 (two) times daily.   Yes [provider]  zolpidem (AMBIEN) 5 MG tablet Take 1 tablet (5 mg total) by mouth at bedtime as needed for sleep. 12/03/22  Yes Charlynne Pander, MD  apixaban (ELIQUIS) 5 MG TABS tablet Take 1 tablet (5 mg total) by mouth 2 (two) times daily. 03/05/22   Victorino Sparrow, MD     Family History  Problem Relation Age of Onset   Stroke Mother    Heart disease Father    Asthma Father    Stroke Sister    Multiple sclerosis Sister    Colon cancer Neg Hx    Pancreatic cancer Neg Hx    Rectal cancer Neg Hx    Stomach cancer Neg Hx    Esophageal cancer Neg Hx    Prostate cancer Neg Hx    Colon polyps Neg Hx     Social History   Socioeconomic History   Marital status: Divorced    Spouse name: Not on file   Number of children: 1   Years of education: 15   Highest education level: Not on file  Occupational History   Occupation: unemployed  Tobacco Use   Smoking status: Some Days    Types: Cigars    Passive exposure: Current   Smokeless tobacco: Never  Vaping Use   Vaping status: Never Used  Substance and Sexual Activity   Alcohol use: Yes    Alcohol/week: 0.0 standard drinks of alcohol    Comment: socially   Drug use: No   Sexual activity: Not on file  Other Topics Concern   Not on file  Social History Narrative   Patient drinks  caffeine occasionally.   Patient is left handed.   Social Determinants of Health   Financial Resource Strain: High Risk (11/24/2021)   Overall Financial Resource Strain (CARDIA)    Difficulty of Paying Living Expenses: Hard  Food Insecurity: Not on File (12/09/2022)   Received from [facility] Insecurity    Food: 0  Transportation Needs: Unmet Transportation Needs (02/18/2022)   PRAPARE - Transportation    Lack of Transportation (Medical): Yes    Lack of Transportation (Non-Medical): Yes  Physical Activity: Sufficiently Active (11/24/2021)   Exercise Vital Sign    Days of Exercise per Week: 7 days    Minutes of Exercise per Session: 30 min  Stress: Stress Concern Present (11/24/2021)   [employer] of Occupational Health - Occupational Stress Questionnaire    Feeling of Stress : To some extent  Social  Connections: Not on File (12/03/2022)   Received from [facility]   Social Connections    Connectedness: 0    Review of Systems: A 12 point ROS discussed and pertinent positives are indicated in the HPI above.  All other systems are negative.  Review of Systems  Constitutional:  Negative for appetite change and fatigue.  Respiratory:  Negative for cough and shortness of breath.   Cardiovascular:  Positive for leg swelling. Negative for chest pain.  Gastrointestinal:  Negative for abdominal pain, diarrhea, nausea and vomiting.  Genitourinary:  Negative for flank pain.  Musculoskeletal:  Negative for back pain.  Neurological:  Negative for dizziness and headaches.    Vital Signs: There were no vitals taken for this visit.  Physical Exam Constitutional:      General: He is not in acute distress.    Appearance: He is not ill-appearing.  HENT:     Mouth/Throat:     Mouth: Mucous membranes are moist.     Pharynx: Oropharynx is clear.  Cardiovascular:     Rate and Rhythm: Normal rate and regular rhythm.     Pulses: Normal pulses.     Heart sounds: Normal heart sounds.   Pulmonary:     Effort: Pulmonary effort is normal.     Breath sounds: Normal breath sounds.  Abdominal:     General: Bowel sounds are normal.     Palpations: Abdomen is soft.     Tenderness: There is no abdominal tenderness.  Musculoskeletal:     Right lower leg: No edema.     Left lower leg: Edema present.  Skin:    General: Skin is warm and dry.  Neurological:     Mental Status: He is alert and oriented to person, place, and time.  Psychiatric:        Mood and Affect: Mood normal.        Behavior: Behavior normal.        Thought Content: Thought content normal.        Judgment: Judgment normal.     Imaging: IR Radiologist Eval & Mgmt  Result Date: 12/01/2022 EXAM: NEW PATIENT OFFICE VISIT CHIEF COMPLAINT: See Epic note. HISTORY OF PRESENT ILLNESS: See Epic note. REVIEW OF SYSTEMS: See Epic note. PHYSICAL EXAMINATION: See Epic note. ASSESSMENT AND PLAN: See Epic note. Marliss Coots, MD [facility] Radiology Electronically Signed   By: Marliss Coots M.D.   On: 12/01/2022 13:38   VAS [place] LOWER EXTREMITY VENOUS (DVT) (ONLY [facility] & [facility])  Result Date: 11/19/2022  Lower Venous DVT Study Patient Name:  KWINTON MAAHS  Date of Exam:   11/18/2022 Medical Rec #: [id number]         Accession #:    [id number] Date of Birth: 1960/06/10         Patient Gender: M Patient Age:   72 years Exam [facility] Procedure:      VAS [place] LOWER EXTREMITY VENOUS (DVT) Referring Phys: Lynden Oxford --------------------------------------------------------------------------------  Indications: Swelling, and History of extensive DVT in left leg on 04/14/22. He has been out of Eliquis for three months. Other Indications: HIstory of CVA x 3. Comparison Study: 04/14/22 - Extensive DVT in left leg. Performing Technologist: Marilynne Halsted RDMS, RVT  Examination Guidelines: A complete evaluation includes B-mode imaging, spectral Doppler, color Doppler, and  power Doppler as needed of all accessible portions of each vessel. Bilateral testing is considered an integral part of a complete examination. Limited examinations  for reoccurring indications may be performed as noted. The reflux portion of the exam is performed with the patient in reverse Trendelenburg.  +-----+---------------+---------+-----------+----------+--------------+ RIGHTCompressibilityPhasicitySpontaneityPropertiesThrombus Aging +-----+---------------+---------+-----------+----------+--------------+ CFV  Full           Yes      Yes                                 +-----+---------------+---------+-----------+----------+--------------+ SFJ  Full                                                        +-----+---------------+---------+-----------+----------+--------------+   +---------+---------------+---------+-----------+----------+-------------------+ LEFT     CompressibilityPhasicitySpontaneityPropertiesThrombus Aging      +---------+---------------+---------+-----------+----------+-------------------+ CFV      Partial        No       No                   Chronic             +---------+---------------+---------+-----------+----------+-------------------+ SFJ      Full                                                             +---------+---------------+---------+-----------+----------+-------------------+ FV Prox  Partial        No       No                   Chronic             +---------+---------------+---------+-----------+----------+-------------------+ FV Mid   Partial                                      Chronic             +---------+---------------+---------+-----------+----------+-------------------+ FV DistalPartial                                                          +---------+---------------+---------+-----------+----------+-------------------+ PFV      Full                                                              +---------+---------------+---------+-----------+----------+-------------------+ POP      Partial        No       No                   Chronic             +---------+---------------+---------+-----------+----------+-------------------+ PTV      None                                                             +---------+---------------+---------+-----------+----------+-------------------+  PERO     None                                                             +---------+---------------+---------+-----------+----------+-------------------+ EIV                                                   Not well visualized +---------+---------------+---------+-----------+----------+-------------------+     Summary: RIGHT: - No evidence of common femoral vein obstruction.  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   *See table(s) above for measurements and observations. Electronically signed by Gerarda Fraction on 11/19/2022 at 8:41:43 AM.    Final    CT Angio Chest PE W and/or Wo Contrast  Result Date: 11/18/2022 CLINICAL DATA:  Pleuritic chest pain.  Leg pain. EXAM: CT ANGIOGRAPHY CHEST WITH CONTRAST TECHNIQUE: Multidetector CT imaging of the chest was performed using the standard protocol during bolus administration of intravenous contrast. Multiplanar CT image reconstructions and MIPs were obtained to evaluate the vascular anatomy. RADIATION DOSE REDUCTION: This exam was performed according to the departmental dose-optimization program which includes automated exposure control, adjustment of the mA and/or kV according to patient size and/or use of iterative reconstruction technique. CONTRAST:  OMNIPAQUE IOHEXOL 350 MG/ML SOLN COMPARISON:  CT abdomen and pelvis 11/18/2022 chest CTA 04/04/2022 FINDINGS: Cardiovascular: Main and lobar pulmonary  arteries are patent without filling defects. Limited evaluation of the segmental and more distal pulmonary arteries due to poor opacification of the pulmonary arteries. No evidence for large or central pulmonary embolism. Normal caliber of the thoracic aorta. Heart size is normal. No significant pericardial effusion. Evidence for an interatrial occlusion device. Mediastinum/Nodes: Esophagus is unremarkable. No significant mediastinal or hilar lymph node enlargement. No axillary lymph node enlargement. Lungs/Pleura: Trachea and mainstem bronchi are patent. No pleural effusions. Mild dependent densities in lungs. No airspace disease or consolidation. Upper Abdomen: Gaseous distension in the visualized colon. Musculoskeletal: No acute bone abnormality. Review of the MIP images confirms the above findings. IMPRESSION: 1. No evidence for large or central pulmonary embolism. Limited evaluation of the segmental and more distal pulmonary arteries due to poor opacification of the pulmonary arteries. 2. No acute chest abnormality. 3. Gaseous distension in the visualized colon. Electronically Signed   By: Richarda Overlie M.D.   On: 11/18/2022 13:37   CT VENOGRAM ABDOMEN PELVIS  Addendum Date: 11/18/2022   ADDENDUM REPORT: 11/18/2022 13:36 ADDENDUM: These results were called by telephone at the time of interpretation on 11/18/2022 at 1:36 pm to provider [facility] , who verbally acknowledged these results. Electronically Signed   By: Marliss Coots M.D.   On: 11/18/2022 13:36   Result Date: 11/18/2022 CLINICAL DATA:  62 year old male, evaluate for deep vein thrombosis extension. EXAM: CT VENOGRAM ABD-PELVIS TECHNIQUE: Multidetector CT imaging of the abdomen and pelvis was performed using the standard protocol after bolus administration of intravenous contrast. Delayed imaging at 90 seconds was also obtained. multiplanar reconstructed images and MIPs were obtained and reviewed to evaluate the vascular anatomy. CONTRAST:   OMNIPAQUE  IOHEXOL 350 MG/ML SOLN COMPARISON:  02/13/2022 FINDINGS: VASCULAR IVC: Normal caliber and patent.  No variant anatomy. Renal veins: Patent bilaterally. 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 Again, there is evidence of extrinsic compression between the right common iliac artery and L4-L5 vertebral bodies. The right common, external, and internal iliac veins are widely patent. Femoral veins: The visualized bilateral common and central aspects of the bilateral superficial and profunda veins are patent. Portal system: Normal caliber and patent main and intrahepatic portal veins. The superior mesenteric and splenic veins are patent. Arterial system: Normal caliber abdominal aorta. Review of the MIP images confirms the above findings. NON-VASCULAR Lower chest: Please refer to the dedicated chest CT from the same day for intrathoracic findings. Hepatobiliary: No focal liver abnormality is seen. No gallstones, gallbladder wall thickening, or biliary dilatation. Pancreas: Unremarkable. No pancreatic ductal dilatation or surrounding inflammatory changes. Spleen: Normal in size without focal abnormality. Adrenals/Urinary Tract: Adrenal glands are unremarkable. Similar appearing multifocal bilateral subcentimeter simple renal cysts. Kidneys are otherwise normal, without renal calculi, focal lesion, or hydronephrosis. Bladder is unremarkable. Stomach/Bowel: Stomach is within normal limits. Appendix appears normal. There is mild dilation of the transverse colon, similar to comparison. No additional evidence of bowel wall thickening, distention, or inflammatory changes. Lymphatic: No abdominopelvic lymphadenopathy. Reproductive: The prostate measures up to 5.2 cm in maximum axial dimension with mild median lobe bulge upon the base of the bladder. Other: No abdominal wall hernia or abnormality. No abdominopelvic ascites. Musculoskeletal: No acute or significant osseous findings.  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 No evidence of acute thrombus. NON-VASCULAR 1. No acute abdominopelvic abnormality. 2. Mild prostatomegaly. Consider referral to Interventional Radiology for chronic venous occlusion recanalization. Marliss Coots, MD [facility] Radiology Electronically Signed: By: Marliss Coots M.D. On: 11/18/2022 13:29    Labs:  CBC: Recent Labs    02/14/22 0456 02/17/22 1058 02/17/22 1616 04/04/22 1854 04/14/22 0747 11/18/22 0935  WBC 9.8  --  10.0 5.3  --  6.9  HGB 11.9*   < > 10.5* 11.0* 12.2* 11.5*  HCT 34.9*   < > 30.8* 35.1* 36.0* 35.2*  PLT 207  --  332 239  --  182   < > = values in this interval not displayed.    COAGS: No results for input(s): "INR", "APTT" in the last 8760 hours.  BMP: Recent Labs    02/13/22 0149 02/14/22 0456 02/17/22 1058 04/04/22 1854 04/14/22 0747 11/18/22 0935  NA 134* 134* 135 138 139 138  K 3.5 4.0 3.5 4.2 3.8 3.6  CL 97* 96* 99 105 101 103  CO2 24 26  --  26  --  26  GLUCOSE 128* 103* 106* 103* 88 96  BUN 15 14 15 8 12 15   CALCIUM 9.5 8.9  --  8.7*  --  8.5*  CREATININE 1.18 1.10 1.00 1.15 1.00 1.05  GFRNONAA >60 >60  --  >60  --  >60    LIVER FUNCTION TESTS: Recent Labs    02/13/22 0149 02/14/22 0456 11/18/22 0935  BILITOT 2.2* 1.0 0.4  AST 39 50* 17  ALT 46* 59* 15  ALKPHOS 62 55 39  PROT 8.1 7.4 6.8  ALBUMIN 3.5 2.9* 3.2*    TUMOR MARKERS: No results for input(s): "AFPTM", "CEA", "CA199", "CHROMGRNA" in the last 8760 hours.  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 Procedure will be performed under general anesthesia with possible overnight observation.   Risks and benefits of this procedure were discussed with the patient including, but not limited to bleeding, infection, vascular injury or contrast induced renal failure.  This interventional procedure involves the use of X-rays and because of the nature of the planned procedure, it is possible that we will have prolonged use of X-ray fluoroscopy.  Potential radiation risks to you include (but are not limited to) the following: - A slightly elevated risk for cancer  several years later in life. This risk is typically less than 0.5% percent. This risk is low in comparison to the normal incidence of human cancer, which is 33% for women and 50% for men according to the American Cancer Society. - Radiation induced injury can include skin redness, resembling a rash, tissue breakdown / ulcers and hair loss (which can be temporary or permanent).   The likelihood of either of these occurring depends on the difficulty of the procedure and whether you are sensitive to radiation due to previous procedures, disease, or genetic conditions.   IF your procedure requires a prolonged use of radiation, you will be notified and given written instructions for further action.  It is your responsibility to monitor the irradiated area for the 2 weeks following the procedure and to notify your physician if you are concerned that you have suffered a radiation induced injury.    All of the patient's questions were answered, patient is agreeable to proceed. He has been NPO. He is a full code. His last dose of Aspirin and Eliquis was ***.  Consent signed and in chart  Thank you for this interesting consult.  I greatly enjoyed meeting EXANDER SHAUL and look forward to participating in their care.  A copy of this report was sent to the requesting  provider on this date.  Electronically Signed: Alwyn Ren, AGACNP-BC [phone] 12/17/2022, 3:12 PM   I spent a total of  30 Minutes   in face to face in clinical consultation, greater than 50% of which was counseling/coordinating care for left lower extremity venogram with possible intervention.

## 2022-12-17 NOTE — Progress Notes (Signed)
SDW CALL  Patient was given pre-op instructions over the phone. The opportunity was given for the patient to ask questions. No further questions asked. Patient verbalized understanding of instructions given.   PCP - [facility] Cardiologist - denies  PPM/ICD - denies Device Orders -  Rep Notified -   Chest x-ray - na EKG - 11/18/22 Stress Test - denies ECHO -  Cardiac Cath - 10/09/04  Sleep Study - denies CPAP -   Fasting Blood Sugar - na Checks Blood Sugar _____ times a day  Blood Thinner Instructions: called radiology PA desk and Dr. Elby Showers for instructions regarding Eliquis. Pt stating he was taking Eliquis according to the instructions on the box. He stated he is currently taking Eliquis 2 times a day.  Aspirin Instructions:na  ERAS Protcol -clears until 0900 PRE-SURGERY Ensure or G2-   COVID TEST- na   Anesthesia review: no  Patient denies shortness of breath, fever, cough and chest pain over the phone call    Surgical Instructions    Your procedure is scheduled on October 7  Report to [facility] Main Entrance "A" at 0930 A.M., then check in with the Admitting office.  Call this number if you have problems the morning of surgery:  [phone]    Remember:  Do not eat after midnight the night before your surgery  You may drink clear liquids until 0900am the morning of your surgery.   Clear liquids allowed are: Water, Non-Citrus Juices (without pulp), Carbonated Beverages, Clear Tea, Black Coffee ONLY (NO MILK, CREAM OR POWDERED CREAMER of any kind), and Gatorade   Take these medicines the morning of surgery with A SIP OF WATER: Trileptal. PRN- Baclofen, Zyrtec As of today, STOP taking any Aspirin (unless otherwise instructed by your surgeon) Aleve, Naproxen, Ibuprofen, Motrin, Advil, Goody's, BC's, all herbal medications, fish oil, and all vitamins.  [facility] is not responsible for any belongings or valuables. .   Do NOT Smoke  (Tobacco/Vaping)  24 hours prior to your procedure  If you use a CPAP at night, you may bring your mask for your overnight stay.   Contacts, glasses, hearing aids, dentures or partials may not be worn into surgery, please bring cases for these belongings   Patients discharged the day of surgery will not be allowed to drive home, and someone needs to stay with them for 24 hours.    Special instructions:    Oral Hygiene is also important to reduce your risk of infection.  Remember - BRUSH YOUR TEETH THE MORNING OF SURGERY WITH YOUR REGULAR TOOTHPASTE   Day of Surgery:  Take a shower the day of or night before with antibacterial soap. Wear Clean/Comfortable clothing the morning of surgery Do not apply any deodorants/lotions.   Do not wear jewelry or makeup Do not wear lotions, powders, perfumes/colognes, or deodorant. Do not shave 48 hours prior to surgery.  Men may shave face and neck. Do not bring valuables to the hospital. Do not wear nail polish, gel polish, artificial nails, or any other type of covering on natural nails (fingers and toes) If you have artificial nails or gel coating that need to be removed by a nail salon, please have this removed prior to surgery. Artificial nails or gel coating may interfere with anesthesia's ability to adequately monitor your vital signs. Remember to brush your teeth WITH YOUR REGULAR TOOTHPASTE.

## 2022-12-17 NOTE — Progress Notes (Signed)
Called radiology PA desk to request preop orders. Also requested instructions regarding patient's Eliquis. Orders to be placed.

## 2022-12-20 ENCOUNTER — Inpatient Hospital Stay (HOSPITAL_BASED_OUTPATIENT_CLINIC_OR_DEPARTMENT_OTHER): Payer: MEDICAID | Admitting: Anesthesiology

## 2022-12-20 ENCOUNTER — Observation Stay (HOSPITAL_COMMUNITY)
Admission: RE | Admit: 2022-12-20 | Discharge: 2022-12-21 | Disposition: A | Discharge status: Home / Self Care | Payer: MEDICAID | Attending: Interventional Radiology | Admitting: Interventional Radiology

## 2022-12-20 ENCOUNTER — Observation Stay (HOSPITAL_COMMUNITY)
Admission: RE | Admit: 2022-12-20 | Discharge: 2022-12-20 | Disposition: A | Discharge status: Home / Self Care | Payer: MEDICAID | Source: Ambulatory Visit | Attending: Interventional Radiology | Admitting: Interventional Radiology

## 2022-12-20 ENCOUNTER — Encounter (HOSPITAL_COMMUNITY)
Admission: RE | Disposition: A | Discharge status: Home / Self Care | Payer: Self-pay | Source: Home / Self Care | Attending: Interventional Radiology

## 2022-12-20 ENCOUNTER — Inpatient Hospital Stay (HOSPITAL_COMMUNITY): Payer: MEDICAID | Admitting: Anesthesiology

## 2022-12-20 DIAGNOSIS — F319 Bipolar disorder, unspecified: Secondary | ICD-10-CM | POA: Diagnosis not present

## 2022-12-20 DIAGNOSIS — Z79899 Other long term (current) drug therapy: Secondary | ICD-10-CM | POA: Insufficient documentation

## 2022-12-20 DIAGNOSIS — F17290 Nicotine dependence, other tobacco product, uncomplicated: Secondary | ICD-10-CM | POA: Diagnosis not present

## 2022-12-20 DIAGNOSIS — Z86711 Personal history of pulmonary embolism: Secondary | ICD-10-CM | POA: Insufficient documentation

## 2022-12-20 DIAGNOSIS — I745 Embolism and thrombosis of iliac artery: Principal | ICD-10-CM

## 2022-12-20 DIAGNOSIS — I824Y2 Acute embolism and thrombosis of unspecified deep veins of left proximal lower extremity: Secondary | ICD-10-CM

## 2022-12-20 DIAGNOSIS — I82402 Acute embolism and thrombosis of unspecified deep veins of left lower extremity: Secondary | ICD-10-CM | POA: Diagnosis present

## 2022-12-20 DIAGNOSIS — I82412 Acute embolism and thrombosis of left femoral vein: Secondary | ICD-10-CM

## 2022-12-20 DIAGNOSIS — Z7901 Long term (current) use of anticoagulants: Secondary | ICD-10-CM | POA: Insufficient documentation

## 2022-12-20 DIAGNOSIS — Z7982 Long term (current) use of aspirin: Secondary | ICD-10-CM | POA: Insufficient documentation

## 2022-12-20 DIAGNOSIS — I87092 Postthrombotic syndrome with other complications of left lower extremity: Principal | ICD-10-CM | POA: Insufficient documentation

## 2022-12-20 DIAGNOSIS — Z8673 Personal history of transient ischemic attack (TIA), and cerebral infarction without residual deficits: Secondary | ICD-10-CM | POA: Insufficient documentation

## 2022-12-20 DIAGNOSIS — I87002 Postthrombotic syndrome without complications of left lower extremity: Secondary | ICD-10-CM | POA: Diagnosis present

## 2022-12-20 DIAGNOSIS — F17210 Nicotine dependence, cigarettes, uncomplicated: Secondary | ICD-10-CM | POA: Diagnosis not present

## 2022-12-20 HISTORY — PX: IR IVUS EACH ADDITIONAL NON CORONARY VESSEL: IMG6086

## 2022-12-20 HISTORY — PX: RADIOLOGY WITH ANESTHESIA: SHX6223

## 2022-12-20 HISTORY — PX: IR INTRAVASCULAR ULTRASOUND NON CORONARY: IMG6085

## 2022-12-20 HISTORY — PX: IR US GUIDE VASC ACCESS LEFT: IMG2389

## 2022-12-20 HISTORY — PX: IR TRANSCATH PLC STENT 1ST ART NOT LE CV CAR VERT CAR: IMG5443

## 2022-12-20 HISTORY — PX: IR THROMBECT PRIM MECH INIT (INCLU) MOD SED: IMG2297

## 2022-12-20 LAB — BASIC METABOLIC PANEL
Anion gap: 6 (ref 5–15)
BUN: 11 mg/dL (ref 8–23)
CO2: 24 mmol/L (ref 22–32)
Calcium: 8.6 mg/dL — ABNORMAL LOW (ref 8.9–10.3)
Chloride: 106 mmol/L (ref 98–111)
Creatinine, Ser: 0.94 mg/dL (ref 0.61–1.24)
GFR, Estimated: >60 mL/min (ref >60)
Glucose, Bld: 81 mg/dL (ref 70–99)
Potassium: 3.4 mmol/L — ABNORMAL LOW (ref 3.5–5.1)
Sodium: 136 mmol/L (ref 135–145)

## 2022-12-20 LAB — PROTIME-INR
INR: 1.1 (ref 0.8–1.2)
Prothrombin Time: 14.6 s (ref 11.4–15.2)

## 2022-12-20 LAB — CBC
HCT: 39.6 % (ref 39.0–52.0)
Hemoglobin: 12.5 g/dL — ABNORMAL LOW (ref 13.0–17.0)
MCH: 28.9 pg (ref 26.0–34.0)
MCHC: 31.6 g/dL (ref 30.0–36.0)
MCV: 91.7 fL (ref 80.0–100.0)
Platelets: 224 10*3/uL (ref 150–400)
RBC: 4.32 MIL/uL (ref 4.22–5.81)
RDW: 13.4 % (ref 11.5–15.5)
WBC: 5.1 10*3/uL (ref 4.0–10.5)
nRBC: 0 % (ref 0.0–0.2)

## 2022-12-20 LAB — SURGICAL PCR SCREEN
MRSA, PCR: NEGATIVE
Staphylococcus aureus: NEGATIVE

## 2022-12-20 SURGERY — IR WITH ANESTHESIA
Anesthesia: General

## 2022-12-20 MED ORDER — CHLORHEXIDINE GLUCONATE 0.12 % MT SOLN
15.0000 mL | Freq: Once | OROMUCOSAL | Status: AC
  Administered 2022-12-20: 15 mL via OROMUCOSAL
  Filled 2022-12-20: qty 15

## 2022-12-20 MED ORDER — ONDANSETRON HCL 4 MG/2ML IJ SOLN: 4.0000 mg | Freq: Four times a day (QID) | INTRAMUSCULAR | Status: DC | PRN

## 2022-12-20 MED ORDER — LORATADINE 10 MG PO TABS
10.0000 mg | ORAL_TABLET | Freq: Every day | ORAL | Status: DC
  Administered 2022-12-20 – 2022-12-21 (×2): 10 mg via ORAL
  Filled 2022-12-20 (×2): qty 1

## 2022-12-20 MED ORDER — HYDROMORPHONE HCL 1 MG/ML IJ SOLN
1.0000 mg | INTRAMUSCULAR | Status: DC | PRN
  Administered 2022-12-20 – 2022-12-21 (×2): 1 mg via INTRAVENOUS
  Filled 2022-12-20 (×2): qty 1

## 2022-12-20 MED ORDER — DEXAMETHASONE SODIUM PHOSPHATE 10 MG/ML IJ SOLN
INTRAMUSCULAR | Status: DC | PRN
  Administered 2022-12-20: 10 mg via INTRAVENOUS

## 2022-12-20 MED ORDER — LACTATED RINGERS IV SOLN: INTRAVENOUS | Status: DC

## 2022-12-20 MED ORDER — IODIXANOL 320 MG/ML IV SOLN
100.0000 mL | Freq: Once | INTRAVENOUS | Status: AC | PRN
  Administered 2022-12-20: 40 mL via INTRAVENOUS

## 2022-12-20 MED ORDER — DIPHENHYDRAMINE HCL 12.5 MG/5ML PO ELIX: 12.5000 mg | ORAL_SOLUTION | Freq: Four times a day (QID) | ORAL | Status: DC | PRN

## 2022-12-20 MED ORDER — PHENYLEPHRINE HCL-NACL 20-0.9 MG/250ML-% IV SOLN
INTRAVENOUS | Status: DC | PRN
  Administered 2022-12-20: 25 ug/min via INTRAVENOUS

## 2022-12-20 MED ORDER — FENTANYL CITRATE (PF) 100 MCG/2ML IJ SOLN: 25.0000 ug | INTRAMUSCULAR | Status: DC | PRN

## 2022-12-20 MED ORDER — SODIUM CHLORIDE 0.9 % IV SOLN: INTRAVENOUS | Status: DC

## 2022-12-20 MED ORDER — DIPHENHYDRAMINE HCL 50 MG/ML IJ SOLN: 12.5000 mg | Freq: Four times a day (QID) | INTRAMUSCULAR | Status: DC | PRN

## 2022-12-20 MED ORDER — OXYCODONE HCL 5 MG/5ML PO SOLN: 5.0000 mg | Freq: Once | ORAL | Status: AC | PRN

## 2022-12-20 MED ORDER — ORAL CARE MOUTH RINSE: 15.0000 mL | Freq: Once | OROMUCOSAL | Status: AC

## 2022-12-20 MED ORDER — ADULT MULTIVITAMIN W/MINERALS CH
1.0000 | ORAL_TABLET | Freq: Every day | ORAL | Status: DC
  Administered 2022-12-20 – 2022-12-21 (×2): 1 via ORAL
  Filled 2022-12-20 (×2): qty 1

## 2022-12-20 MED ORDER — OXYCODONE HCL 5 MG PO TABS
5.0000 mg | ORAL_TABLET | Freq: Once | ORAL | Status: AC | PRN
  Administered 2022-12-20: 5 mg via ORAL

## 2022-12-20 MED ORDER — SODIUM CHLORIDE 0.9% FLUSH: 9.0000 mL | INTRAVENOUS | Status: DC | PRN

## 2022-12-20 MED ORDER — PHENYLEPHRINE 80 MCG/ML (10ML) SYRINGE FOR IV PUSH (FOR BLOOD PRESSURE SUPPORT)
PREFILLED_SYRINGE | INTRAVENOUS | Status: DC | PRN
  Administered 2022-12-20 (×4): 160 ug via INTRAVENOUS

## 2022-12-20 MED ORDER — ASPIRIN 81 MG PO CHEW
81.0000 mg | CHEWABLE_TABLET | Freq: Every day | ORAL | Status: DC
  Administered 2022-12-20 – 2022-12-21 (×2): 81 mg via ORAL
  Filled 2022-12-20 (×2): qty 1

## 2022-12-20 MED ORDER — ONDANSETRON HCL 4 MG/2ML IJ SOLN
INTRAMUSCULAR | Status: DC | PRN
  Administered 2022-12-20: 4 mg via INTRAVENOUS

## 2022-12-20 MED ORDER — FENTANYL CITRATE (PF) 250 MCG/5ML IJ SOLN
INTRAMUSCULAR | Status: DC | PRN
  Administered 2022-12-20 (×2): 50 ug via INTRAVENOUS

## 2022-12-20 MED ORDER — NALOXONE HCL 0.4 MG/ML IJ SOLN: 0.4000 mg | INTRAMUSCULAR | Status: DC | PRN

## 2022-12-20 MED ORDER — FENTANYL CITRATE (PF) 100 MCG/2ML IJ SOLN
INTRAMUSCULAR | Status: AC
  Filled 2022-12-20: qty 2

## 2022-12-20 MED ORDER — HEPARIN SODIUM (PORCINE) 1000 UNIT/ML IJ SOLN
INTRAMUSCULAR | Status: DC | PRN
Start: 2022-12-20 — End: 2022-12-20
  Administered 2022-12-20: 8000 [IU] via INTRAVENOUS
  Administered 2022-12-20: 3000 [IU] via INTRAVENOUS

## 2022-12-20 MED ORDER — ENOXAPARIN SODIUM 100 MG/ML IJ SOSY
94.0000 mg | PREFILLED_SYRINGE | INTRAMUSCULAR | Status: AC
  Administered 2022-12-20: 95 mg via SUBCUTANEOUS
  Filled 2022-12-20: qty 0.95

## 2022-12-20 MED ORDER — HYDRALAZINE HCL 20 MG/ML IJ SOLN: 10.0000 mg | Freq: Four times a day (QID) | INTRAMUSCULAR | Status: DC | PRN

## 2022-12-20 MED ORDER — HYDROMORPHONE 1 MG/ML IV SOLN: INTRAVENOUS | Status: DC

## 2022-12-20 MED ORDER — OXCARBAZEPINE 300 MG PO TABS
300.0000 mg | ORAL_TABLET | Freq: Two times a day (BID) | ORAL | Status: DC
  Administered 2022-12-20 – 2022-12-21 (×2): 300 mg via ORAL
  Filled 2022-12-20 (×3): qty 1

## 2022-12-20 MED ORDER — ENOXAPARIN SODIUM 100 MG/ML IJ SOSY
100.0000 mg | PREFILLED_SYRINGE | Freq: Two times a day (BID) | INTRAMUSCULAR | Status: DC
  Administered 2022-12-21: 100 mg via SUBCUTANEOUS
  Filled 2022-12-20: qty 1

## 2022-12-20 MED ORDER — PROPOFOL 10 MG/ML IV BOLUS
INTRAVENOUS | Status: DC | PRN
  Administered 2022-12-20: 150 mg via INTRAVENOUS
  Administered 2022-12-20: 50 mg via INTRAVENOUS

## 2022-12-20 MED ORDER — ONDANSETRON HCL 4 MG/2ML IJ SOLN: 4.0000 mg | Freq: Once | INTRAMUSCULAR | Status: DC | PRN

## 2022-12-20 MED ORDER — ACETAMINOPHEN 500 MG PO TABS
1000.0000 mg | ORAL_TABLET | Freq: Once | ORAL | Status: AC
  Administered 2022-12-20: 1000 mg via ORAL
  Filled 2022-12-20: qty 2

## 2022-12-20 MED ORDER — LIDOCAINE 2% (20 MG/ML) 5 ML SYRINGE
INTRAMUSCULAR | Status: DC | PRN
  Administered 2022-12-20: 50 mg via INTRAVENOUS

## 2022-12-20 MED ORDER — OXYCODONE HCL 5 MG PO TABS
ORAL_TABLET | ORAL | Status: AC
  Filled 2022-12-20: qty 1

## 2022-12-20 MED ORDER — BACLOFEN 10 MG PO TABS
10.0000 mg | ORAL_TABLET | Freq: Two times a day (BID) | ORAL | Status: DC | PRN
  Administered 2022-12-20: 20 mg via ORAL
  Filled 2022-12-20: qty 2

## 2022-12-20 MED ORDER — ROCURONIUM BROMIDE 10 MG/ML (PF) SYRINGE
PREFILLED_SYRINGE | INTRAVENOUS | Status: DC | PRN
  Administered 2022-12-20: 30 mg via INTRAVENOUS
  Administered 2022-12-20: 50 mg via INTRAVENOUS
  Administered 2022-12-20: 20 mg via INTRAVENOUS

## 2022-12-20 NOTE — Anesthesia Postprocedure Evaluation (Signed)
Anesthesia Post Note  Patient: Andrew Kaiser  Procedure(s) Performed: LLE Venogram and Recanalization with Possible Left Iliac Vein Stent Placement     Patient location during evaluation: PACU Anesthesia Type: General Level of consciousness: awake and alert Pain management: pain level controlled Vital Signs Assessment: post-procedure vital signs reviewed and stable Respiratory status: spontaneous breathing, nonlabored ventilation and respiratory function stable Cardiovascular status: stable and blood pressure returned to baseline Anesthetic complications: no   No notable events documented.  Last Vitals:  Vitals:   12/20/22 1548 12/20/22 1609  BP: 118/86 127/81  Pulse: 83 84  Resp: 20 (!) 22  Temp: 36.6 C   SpO2: 99% 98%    Last Pain:  Vitals:   12/20/22 1609  TempSrc:   PainSc: 0-No pain                 Beryle Lathe

## 2022-12-20 NOTE — Anesthesia Procedure Notes (Addendum)
Procedure Name: Intubation Date/Time: 12/20/2022 1:20 PM  Performed by: Darryl Nestle, CRNAPre-anesthesia Checklist: Patient identified, Emergency Drugs available, Suction available and Patient being monitored Patient Re-evaluated:Patient Re-evaluated prior to induction Oxygen Delivery Method: Circle system utilized Preoxygenation: Pre-oxygenation with 100% oxygen Induction Type: IV induction Ventilation: Mask ventilation without difficulty Laryngoscope Size: 4 and Mac Grade View: Grade I Tube type: Oral Tube size: 6.5 mm Number of attempts: 1 Airway Equipment and Method: Stylet and Oral airway Placement Confirmation: ETT inserted through vocal cords under direct vision, positive ETCO2 and breath sounds checked- equal and bilateral Secured at: 54 cm Tube secured with: Tape Dental Injury: Teeth and Oropharynx as per pre-operative assessment

## 2022-12-20 NOTE — H&P (Deleted)
The note originally documented on this encounter has been moved the the encounter in which it belongs.

## 2022-12-20 NOTE — H&P (Signed)
Chief Complaint: Patient was seen in consultation today for LLE venous recanalization and stenting   Supervising Physician: Marliss Coots  Patient Status: [facility] - Out-pt  History of Present Illness: Andrew Kaiser is a 62 y.o. male with a past medical history significant for bipolar disorder, stroke, PFO, anxiety, depression, pulmonary embolism and DVT. He was first diagnosed with a left lower extremity DVT (left popliteal vein through left common iliac vein) December 2023 and he underwent thrombectomy/balloon venoplasty with Vascular Surgery. He was discharged on Eliquis.    Follow up imaging in January 2023 showed reocclusion of the left-sided iliac veins, common femoral vein and femoral vein. He was taken back to the OR 04/14/22 by Vascular but they were unable to get a wire into the vessels. He was treated conservatively with anticoagulation, compression and elevation. He established care with Hematology for further work up.    He presented to the ED 11/18/22 with complaints of left leg pain and swelling extending to his groin. He also complained of abdominal pain, pleuritic chest pain and shortness of breath. He had not taken his Eliquis for approximately 3 months due to cost and other issues related to obtaining this medication.    Work up in the ED was positive for a persistent clot in the leg and Dr. Elby Showers was contacted by the ED physician and asked to evaluate this patient. Dr. 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 He was discharged from the ED with a prescription for Eliquis and outpatient follow-up with Dr. Elby Showers to discuss treatment options.    The patient met with Dr. Elby Showers 12/01/22 for evaluation. He endorsed constant LLE swelling and pain that had started December 2023. He also complained of heaviness, pruritus and numbness/tingling in the left leg. He denied  having issues with poor wound healing. He requires a cane for ambulation and wears thigh high compression stockings daily. He stated he had been compliant with Eliquis since his ED visit.    Dr. Elby Showers discussed the risks, benefits, periprocedural expectations of left iliofemoral venous recanalization and possible left iliac vein stent placement. He discussed additional management expectations including medication compliance, follow up with Hematology, and smoking cessation. He was in agreement and wished to proceed.   He is scheduled today for procedure. PMHx, meds, labs, imaging, allergies reviewed. Feels well, no recent fevers, chills, illness. Has been NPO today as directed.    Past Medical History:  Diagnosis Date   Allergy    Anemia    Anxiety    Arthritis    knees    Bipolar affective (HCC)    Depression    Memory disturbance 10/07/2014   PFO (patent foramen ovale)    Stroke [facility])    x3    Past Surgical History:  Procedure Laterality Date   COLONOSCOPY     IR RADIOLOGIST EVAL & MGMT  12/01/2022   IVC VENOGRAPHY N/A 04/14/2022   Procedure: IVC Venography;  Surgeon: Victorino Sparrow, MD;  Location: [facility] INVASIVE CV LAB;  Service: Cardiovascular;  Laterality: N/A;   LOWER EXTREMITY VENOGRAPHY Left 04/14/2022   Procedure: LOWER EXTREMITY VENOGRAPHY;  Surgeon: Victorino Sparrow, MD;  Location: [facility] INVASIVE CV LAB;  Service: Cardiovascular;  Laterality: Left;   PATENT FORAMEN OVALE CLOSURE  2006   PERIPHERAL VASCULAR BALLOON ANGIOPLASTY Left 02/17/2022   Procedure: PERIPHERAL VASCULAR BALLOON ANGIOPLASTY;  Surgeon: Victorino Sparrow, MD;  Location: [facility]  INVASIVE CV LAB;  Service: Cardiovascular;  Laterality: Left;   PERIPHERAL VASCULAR THROMBECTOMY N/A 02/17/2022   Procedure: VENOUS THROMBECTOMY;  Surgeon: Victorino Sparrow, MD;  Location: [facility] INVASIVE CV LAB;  Service: Cardiovascular;  Laterality: N/A;   POLYPECTOMY     RHINOPLASTY      Allergies: Patient has no known  allergies.  Medications: Prior to Admission medications   Medication Sig Start Date End Date Taking? Authorizing Provider  apixaban (ELIQUIS) 5 MG TABS tablet Take 1 tablet (5 mg total) by mouth 2 (two) times daily. 03/05/22   Victorino Sparrow, MD  APIXABAN Everlene Balls) VTE STARTER PACK (10MG  AND 5MG ) Take as directed on package: start with two-5mg  tablets twice daily for 7 days. On day 8, switch to one-5mg  tablet twice daily. 11/18/22   Tegeler, Canary Brim, MD  baclofen (LIORESAL) 10 MG tablet Take 10-20 mg by mouth 2 (two) times daily as needed for muscle spasms. 11/08/22   [provider]  cetirizine (ZYRTEC) 10 MG tablet Take 10 mg by mouth daily as needed for allergies.    [provider]  Multiple Vitamin (MULTIVITAMIN WITH MINERALS) TABS tablet Take 1 tablet by mouth daily.    [provider]  Oxcarbazepine (TRILEPTAL) 300 MG tablet Take 300 mg by mouth 2 (two) times daily.    [provider]  zolpidem (AMBIEN) 5 MG tablet Take 1 tablet (5 mg total) by mouth at bedtime as needed for sleep. Patient not taking: Reported on 12/20/2022 12/03/22   Charlynne Pander, MD     Family History  Problem Relation Age of Onset   Stroke Mother    Heart disease Father    Asthma Father    Stroke Sister    Multiple sclerosis Sister    Colon cancer Neg Hx    Pancreatic cancer Neg Hx    Rectal cancer Neg Hx    Stomach cancer Neg Hx    Esophageal cancer Neg Hx    Prostate cancer Neg Hx    Colon polyps Neg Hx     Social History   Socioeconomic History   Marital status: Divorced    Spouse name: Not on file   Number of children: 1   Years of education: 15   Highest education level: Not on file  Occupational History   Occupation: unemployed  Tobacco Use   Smoking status: Some Days    Types: Cigars    Passive exposure: Current   Smokeless tobacco: Never  Vaping Use   Vaping status: Never Used  Substance and Sexual Activity   Alcohol use: Yes     Alcohol/week: 0.0 standard drinks of alcohol    Comment: socially   Drug use: Not Currently   Sexual activity: Not on file  Other Topics Concern   Not on file  Social History Narrative   Patient drinks caffeine occasionally.   Patient is left handed.   Social Determinants of Health   Financial Resource Strain: High Risk (11/24/2021)   Overall Financial Resource Strain (CARDIA)    Difficulty of Paying Living Expenses: Hard  Food Insecurity: Not on File (12/09/2022)   Received from [facility] Insecurity    Food: 0  Transportation Needs: Unmet Transportation Needs (02/18/2022)   PRAPARE - Transportation    Lack of Transportation (Medical): Yes    Lack of Transportation (Non-Medical): Yes  Physical Activity: Sufficiently Active (11/24/2021)   Exercise Vital Sign    Days of Exercise per Week: 7 days  Minutes of Exercise per Session: 30 min  Stress: Stress Concern Present (11/24/2021)   [employer] of Occupational Health - Occupational Stress Questionnaire    Feeling of Stress : To some extent  Social Connections: Not on File (12/03/2022)   Received from [facility]   Social Connections    Connectedness: 0    Review of Systems: A 12 point ROS discussed and pertinent positives are indicated in the HPI above.  All other systems are negative.  Review of Systems  Vital Signs: There were no vitals taken for this visit.  Physical Exam Constitutional:      Appearance: He is not ill-appearing.  HENT:     Mouth/Throat:     Mouth: Mucous membranes are moist.     Pharynx: Oropharynx is clear.  Cardiovascular:     Rate and Rhythm: Normal rate and regular rhythm.     Heart sounds: Normal heart sounds.  Pulmonary:     Effort: Pulmonary effort is normal. No respiratory distress.     Breath sounds: Normal breath sounds.  Musculoskeletal:        General: No tenderness.     Left lower leg: Edema present.  Skin:    General: Skin is warm and dry.  Neurological:     General: No focal  deficit present.     Mental Status: He is alert and oriented to person, place, and time.  Psychiatric:        Mood and Affect: Mood normal.        Thought Content: Thought content normal.     Imaging: IR Radiologist Eval & Mgmt  Result Date: 12/01/2022 EXAM: NEW PATIENT OFFICE VISIT CHIEF COMPLAINT: See Epic note. HISTORY OF PRESENT ILLNESS: See Epic note. REVIEW OF SYSTEMS: See Epic note. PHYSICAL EXAMINATION: See Epic note. ASSESSMENT AND PLAN: See Epic note. Marliss Coots, MD [facility] Radiology Electronically Signed   By: Marliss Coots M.D.   On: 12/01/2022 13:38    Labs:  CBC: Recent Labs    02/17/22 1616 04/04/22 1854 04/14/22 0747 11/18/22 0935 12/20/22 0930  WBC 10.0 5.3  --  6.9 5.1  HGB 10.5* 11.0* 12.2* 11.5* 12.5*  HCT 30.8* 35.1* 36.0* 35.2* 39.6  PLT 332 239  --  182 224    COAGS: Recent Labs    12/20/22 0930  INR 1.1    BMP: Recent Labs    02/14/22 0456 02/17/22 1058 04/04/22 1854 04/14/22 0747 11/18/22 0935 12/20/22 0930  NA 134*   < > 138 139 138 136  K 4.0   < > 4.2 3.8 3.6 3.4*  CL 96*   < > 105 101 103 106  CO2 26  --  26  --  26 24  GLUCOSE 103*   < > 103* 88 96 81  BUN 14   < > 8 12 15 11   CALCIUM 8.9  --  8.7*  --  8.5* 8.6*  CREATININE 1.10   < > 1.15 1.00 1.05 0.94  GFRNONAA >60  --  >60  --  >60 >60   < > = values in this interval not displayed.    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 Procedure  will be performed under general anesthesia with possible overnight observation.    Risks and benefits of this procedure were discussed with the patient including, but not limited to bleeding, infection, vascular injury or contrast induced renal failure.   This interventional procedure involves the use of X-rays and because of the nature of the planned procedure, it is possible that we will have prolonged use of X-ray fluoroscopy.   Potential radiation risks to you include (but are not limited to) the following: - A slightly elevated risk for cancer several years later in life. This risk is typically less than 0.5% percent. This risk is low in comparison to the normal incidence of human cancer, which is 33% for women and 50% for men according to the American Cancer Society. - Radiation induced injury can include skin redness, resembling a rash, tissue breakdown / ulcers and hair loss (which can be temporary or permanent).    The likelihood of either of these occurring depends on the difficulty of the procedure and whether you are sensitive to radiation due to previous procedures, disease, or genetic conditions.    IF your procedure requires a prolonged use of radiation, you will be notified and given written instructions for further action.  It is your responsibility to monitor the irradiated area for the 2 weeks following the procedure and to notify your physician if you are concerned that you have suffered a radiation induced injury.     All of the patient's questions were answered, patient is agreeable to proceed. He has been NPO. He is a full code. His has continued taking Aspirin and Eliquis.   Electronically Signed: Brayton El, PA-C 12/20/2022, 11:15 AM   I spent a total of 30 minutes in face to face in clinical consultation, greater than 50% of which was counseling/coordinating care for LLE venogram with angioplasty

## 2022-12-20 NOTE — Anesthesia Preprocedure Evaluation (Addendum)
Anesthesia Evaluation  Patient identified by MRN, date of birth, ID band Patient awake    Reviewed: Allergy & Precautions, NPO status , Patient's Chart, lab work & pertinent test results  History of Anesthesia Complications Negative for: history of anesthetic complications  Airway Mallampati: I  TM Distance: >3 FB Neck ROM: Full    Dental  (+) Dental Advisory Given, Teeth Intact   Pulmonary Current Smoker and Patient abstained from smoking., PE   Pulmonary exam normal        Cardiovascular + Peripheral Vascular Disease and + DVT  Normal cardiovascular exam     Neuro/Psych  PSYCHIATRIC DISORDERS Anxiety Depression Bipolar Disorder   CVA, Residual Symptoms    GI/Hepatic negative GI ROS, Neg liver ROS,,,  Endo/Other  negative endocrine ROS    Renal/GU negative Renal ROS     Musculoskeletal  (+) Arthritis ,    Abdominal   Peds  Hematology  On eliquis    Anesthesia Other Findings   Reproductive/Obstetrics                             Anesthesia Physical Anesthesia Plan  ASA: 3  Anesthesia Plan: General   Post-op Pain Management: Tylenol PO (pre-op)*   Induction: Intravenous  PONV Risk Score and Plan: 2 and Treatment may vary due to age or medical condition, Ondansetron, Propofol infusion and Midazolam  Airway Management Planned: Oral ETT  Additional Equipment: Arterial line  Intra-op Plan:   Post-operative Plan: Extubation in OR  Informed Consent: I have reviewed the patients History and Physical, chart, labs and discussed the procedure including the risks, benefits and alternatives for the proposed anesthesia with the patient or authorized representative who has indicated his/her understanding and acceptance.     Dental advisory given  Plan Discussed with: CRNA and Anesthesiologist  Anesthesia Plan Comments:        Anesthesia Quick Evaluation

## 2022-12-20 NOTE — Anesthesia Procedure Notes (Signed)
Arterial Line Insertion Start/End10/09/2022 12:30 PM, 12/20/2022 12:40 PM Performed by: Darryl Nestle, CRNA, CRNA  Patient location: OR. Preanesthetic checklist: patient identified, IV checked, site marked, risks and benefits discussed, surgical consent, monitors and equipment checked, pre-op evaluation, timeout performed and anesthesia consent Lidocaine 1% used for infiltration Left, radial was placed Catheter size: 20 G Hand hygiene performed  and maximum sterile barriers used   Attempts: 2 Procedure performed using ultrasound guided technique. Ultrasound Notes:anatomy identified, needle tip was noted to be adjacent to the nerve/plexus identified and no ultrasound evidence of intravascular and/or intraneural injection Following insertion, dressing applied and Biopatch. Post procedure assessment: normal and unchanged  Post procedure complications: unsuccessful attempts. Patient tolerated the procedure well with no immediate complications.

## 2022-12-20 NOTE — Progress Notes (Signed)
Foley intact; lovenox given as per order; thigh high compression hose placed in IR as per order. SCDs placed in IR as per order. Patient extubated without difficulty. Patient alert and oriented. Bedside report given to PACU RN. Pulses and left popliteal site verified at bedside. Left popliteal site clean dry and intact; no hematoma or drainage. Pulses palpable. Patient denies any s/s of distress. None noted. Receiving RN denies having any questions. Stent cards labeled and in chart; RN and patient notified. All belongings confirmed with patient.

## 2022-12-20 NOTE — Procedures (Signed)
Interventional Radiology Procedure Note  Procedure:  1) Left lower extremity venogram 2) Left iliofemoral balloon venoplasty 3) Left iliofemoral stent placement  Findings: Please refer to procedural dictation for full description.  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  Technically successful recanalization, venoplasty, and self-expanding uncovered stent placement spanning the left common iliac to left common femoral veins.    Complications: None immediate  Estimated Blood Loss: < 5 mL  Recommendations: 1 hour bedrest. 1 mg/kg Lovenox BID, starting in IR. ASA 81 mg every day. Left thigh high TED hose. Bilateral SCDs at all times unless ambulating. Admit to IR overnight. PCA for pain control, transition to oral analgesics in AM. Advance diet as tolerated. Foley out.   Marliss Coots, MD Pager: [phone]

## 2022-12-20 NOTE — Transfer of Care (Signed)
Immediate Anesthesia Transfer of Care Note  Patient: Andrew Kaiser  Procedure(s) Performed: LLE Venogram and Recanalization with Possible Left Iliac Vein Stent Placement  Patient Location: PACU  Anesthesia Type:General  Level of Consciousness: awake, alert , and oriented  Airway & Oxygen Therapy: Patient Spontanous Breathing  Post-op Assessment: Report given to RN and Post -op Vital signs reviewed and stable  Post vital signs: Reviewed and stable  Last Vitals:  Vitals Value Taken Time  BP 115/85   Temp 98   Pulse 82 12/20/22 1552  Resp 17 12/20/22 1552  SpO2 97 % 12/20/22 1552  Vitals shown include unfiled device data.  Last Pain:  Vitals:   12/20/22 0940  TempSrc:   PainSc: 7       Patients Stated Pain Goal: 2 (12/20/22 0940)  Complications: No notable events documented.

## 2022-12-20 NOTE — Sedation Documentation (Signed)
ACTS 1408- 140 1424- 104 1500- 200

## 2022-12-21 ENCOUNTER — Other Ambulatory Visit: Payer: Self-pay

## 2022-12-21 ENCOUNTER — Encounter (HOSPITAL_COMMUNITY): Payer: Self-pay | Admitting: Radiology

## 2022-12-21 DIAGNOSIS — I87092 Postthrombotic syndrome with other complications of left lower extremity: Secondary | ICD-10-CM | POA: Diagnosis not present

## 2022-12-21 LAB — POCT ACTIVATED CLOTTING TIME
Activated Clotting Time: 104 s
Activated Clotting Time: 140 s
Activated Clotting Time: 208 s

## 2022-12-21 MED ORDER — ENOXAPARIN SODIUM 100 MG/ML IJ SOSY: 100.0000 mg | PREFILLED_SYRINGE | Freq: Two times a day (BID) | INTRAMUSCULAR | 0 refills | Status: AC

## 2022-12-21 MED ORDER — POTASSIUM CHLORIDE CRYS ER 20 MEQ PO TBCR
40.0000 meq | EXTENDED_RELEASE_TABLET | Freq: Once | ORAL | Status: AC
  Administered 2022-12-21: 40 meq via ORAL
  Filled 2022-12-21: qty 2

## 2022-12-21 MED ORDER — ASPIRIN 81 MG PO CHEW: 81.0000 mg | CHEWABLE_TABLET | Freq: Every day | ORAL | 0 refills | Status: AC

## 2022-12-21 NOTE — Discharge Summary (Signed)
Patient ID: Andrew Kaiser MRN: [id number] DOB/AGE: March 26, 1960 62 y.o.  Admit date: 12/20/2022 Discharge date: 12/21/2022  Supervising Physician: Andrew Kaiser  Patient Status: [facility] - In-pt  Admission Diagnoses: Left lower extremity DVT  Discharge Diagnoses:  Principal Problem:   Post-thrombotic syndrome of left lower extremity  Discharged Condition: good  Hospital Course:  Andrew Kaiser is a 62 y.o. male with a past medical history significant for bipolar disorder, stroke, PFO, anxiety, depression, pulmonary embolism and DVT. He was first diagnosed with a left lower extremity DVT (left popliteal vein through left common iliac vein) December 2023 and he underwent thrombectomy/balloon venoplasty with Vascular Surgery. He was discharged on Eliquis.    Follow up imaging in January 2023 showed reocclusion of the left-sided iliac veins, common femoral vein and femoral vein. He was taken back to the OR 04/14/22 by Vascular but they were unable to get a wire into the vessels. He was treated conservatively with anticoagulation, compression and elevation. He established care with Hematology for further work up.    He presented to the ED 11/18/22 with complaints of left leg pain and swelling extending to his groin. He also complained of abdominal pain, pleuritic chest pain and shortness of breath. He had not taken his Eliquis for approximately 3 months due to cost and other issues related to obtaining this medication.    Work up in the ED was positive for a persistent clot in the leg and Dr. Elby Kaiser was contacted by the ED physician and asked to evaluate this patient. Dr. 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 He was discharged from the ED with a prescription for Eliquis and outpatient follow-up with Dr. Elby Kaiser to discuss treatment options.    The patient met with Dr. Elby Kaiser  12/01/22 for evaluation. He endorsed constant LLE swelling and pain that had started December 2023. He also complained of heaviness, pruritus and numbness/tingling in the left leg. He denied having issues with poor wound healing. He requires a cane for ambulation and wears thigh high compression stockings daily. He stated he had been compliant with Eliquis since his ED visit.    Dr. Elby Kaiser discussed the risks, benefits, periprocedural expectations of left iliofemoral venous recanalization and possible left iliac vein stent placement. He discussed additional management expectations including medication compliance, follow up with Hematology, and smoking cessation. He was in agreement and wished to proceed.   The patient presented to the [facility] Interventional Radiology department 12/20/22 and underwent recanalization of the left femoral and iliac veins, balloon venoplasty of the left femoral and iliac veins, mechanical thrombectomy of the left femoral and iliac veins and iliofemoral stent placement. The procedure was done under general anesthesia and the patient was admitted for overnight observation.  The patient was evaluated this morning and he is eager for hospital discharge. He was found to be dressed and ready to leave. He states he feels great this morning and has ambulated and voided. He is waiting for his breakfast tray to arrive. Vitals are within normal limits. His potassium was mildly low on yesterday's labs and he received a one time dose of oral potassium this morning. We discussed his post-procedure follow up which will include imaging studies (CTA Abdomen/Pelvis; LLE venous duplex) in one month and a follow up with Dr. Elby Kaiser after the imaging studies are complete.   We discussed his anticoagulation/antiplatelet regimen - Daily 81 mg aspirin and  100 mg lovenox subcutaneously injected twice daily. He was instructed to not take Eliquis while he is on Lovenox. He will resume Eliquis when he is no  longer on Lovenox and this will be determined by Dr. Elby Kaiser. Patient verbalized understanding and he stated he was comfortable giving himself the Lovenox injections.   Patient instructed to take off the post-procedure dressing on the back of his left knee tonight. He is ok to shower but he will not submerge the site in water for one week.   Patient knows he can call our office with any questions prior to his follow up with Dr. Elby Kaiser.   Consults: None  Significant Diagnostic Studies: IR TRANSCATH PLC STENT 1ST ART NOT LE CV CAR VERT CAR  Result Date: 12/20/2022 INDICATION: 62 year old male with a history of severe left lower extremity post-thrombotic syndrome (Vill[place] 20) secondary to May-Thurner Syndrome after acute iliofemoral DVT in December 2023 status post thrombectomy with early re-thrombosis. He is referred to IR after recent ED visit for lower extremity pain and shortness of breath, at the time non-compliant with anticoagulation. He is now compliant after getting medication refill. EXAM: 1. Ultrasound-guided vascular access of the left popliteal vein. 2. Left lower extremity venogram. 3. Intravascular ultrasound. 4. Catheter and wire recanalization of the left femoral and iliac veins. 5. Balloon venoplasty of the left femoral and iliac veins. 6. Mechanical thrombectomy of the left femoral and iliac veins. 7. Iliofemoral stent placement. COMPARISON:  11/18/2022 MEDICATIONS: 11000 units heparin, intravenous ANESTHESIA/SEDATION: The procedure was performed under general anesthesia. FLUOROSCOPY TIME:  One hundred forty-three mGy CONTRAST:  80 mL visipaque 320, intravenous COMPLICATIONS: None immediate. TECHNIQUE: Informed written consent was obtained from the patient after a thorough discussion of the procedural risks, benefits and alternatives. All questions were addressed. Maximal Sterile Barrier Technique was utilized including caps, mask, sterile gowns, sterile gloves, sterile drape, hand hygiene  and skin antiseptic. A timeout was performed prior to the initiation of the procedure. The patient was positioned prone on the interventional radiology table. Preprocedure ultrasound evaluation of the left popliteal region demonstrated a patent appearing popliteal vein. The procedure was planned. A small skin nick was made at the planned needle entry site. Under direct ultrasound visualization, a 21 gauge micropuncture was used to puncture the popliteal vein. A Nitrex wire was inserted and directed into the mid femoral vein. A micropuncture sheath was then introduced and left lower extremity venogram was performed. There is patency of the peripheral femoral vein with multiple collaterals arising in the mid femoral vein. A central femoral vein appeared patent to the level of the femoral head where there is occlusion with multiple collateral stopping into the pelvis. Over a Bentson wire, the micropuncture sheath was exchanged for an 8 [place] vascular sheath. Next, an angled Navicross catheter and stiff Glidewire were used to recanalized the femoral, common femoral, external iliac, and common iliac veins to the level of the inferior vena cava. An Amplatz wire was placed in the Navicross catheter was removed. Intravascular ultrasound was then performed throughout the left lower extremity and left pelvic veins. Intravascular ultrasound was significant for near complete occlusion of the femoral, common femoral, external iliac, and common iliac veins. The inferior vena cava was patent. The Amplatz wire was then directed to the level of the right subclavian vein. Over the wire, balloon venoplasty was performed throughout the occluded veins extending from the common iliac vein to the peripheral aspect of the femoral vein with an 8 mm x 100 mm  Atheletis balloon in multiple stations. Intravascular ultrasound was then repeated which demonstrated a patent channel through the previously occluded veins. There was noticeable  extrinsic compression at the level of the central common femoral vein secondary to the overlying right common iliac artery. Next, the 8 [place] sheath was upsized for a 13 [place] ClotTriever sheath. The ClotTriever device was then directed into the inferior vena cava and a total of 3 passes were performed throughout the iliofemoral occluded segment. Scant, chronic appearing thrombus was removed. Pelvic venogram was performed which demonstrated no significant opacification of the iliac veins. Next, balloon venoplasty was performed with a 14 mm x 4 cm atlas balloon through the common and external iliac as well as the common femoral vein. This was followed by placement of a 16 mm x 100 mm Abre self-expanding stent positioned at the level of the common iliac vein extending into the central aspect of the external iliac vein. An additional 16 mm x 150 mm Abre self-expanding stent was placed with approximately 4 cm of overlap extending to the peripheral aspect of the common femoral vein. Post deployment balloon molding with a 14 mm atlas balloon was performed throughout. Next, additional venoplasty was performed throughout the femoral vein with the 8 mm x 100 mm Atheltis balloon. Repeat intravascular ultrasound demonstrated significantly improved patency throughout the femoral vein with excellent wall apposition of the newly placed stents in the iliofemoral segment which were patent throughout. Completion venogram of the femoral vein demonstrated significantly improved patency and flow with diminished flow into the previously visualized collateral veins in the mid femoral region and pelvis. Pelvic venogram demonstrated widely patent newly placed stents with brisk antegrade flow into the patent inferior vena cava. The catheters and wires were removed. An absorbable suture was used tire pursestring at the sheath entry site and manual compression was applied to achieve hemostasis. A sterile bandage was applied. The patient  received 1 milligram/kilogram Lovenox subcutaneously prior to departing the IR department. The patient tolerated procedure well and was transferred to the postanesthesia care unit in good condition. IMPRESSION: 1. 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 2. Technically successful catheter and wire recanalization of the occluded iliofemoral venous segment followed by technically successful balloon venoplasty, mechanical thrombectomy, and self expanding stent placement extending from the common iliac vein through the peripheral aspect of the common femoral vein. PLAN: Plan for 1 milligram/kilogram Lovenox for at least 1 month. Begin daily 81 mg aspirin. Plan for 1 month CT venogram abdomen pelvis with left lower extremity venous duplex and interventional radiology clinic follow-up shortly thereafter. Andrew Coots, MD [facility] Electronically Signed   By: Andrew Kaiser M.D.   On: 12/20/2022 21:13   IR US Guide Vasc Access Left  Result Date: 12/20/2022 INDICATION: 62 year old male with a history of severe left lower extremity post-thrombotic syndrome (Villalta 20) secondary to May-Thurner Syndrome after acute iliofemoral DVT in December 2023 status post thrombectomy with early re-thrombosis. He is referred to IR after recent ED visit for lower extremity pain and shortness of breath, at the time non-compliant with anticoagulation. He is now compliant after getting medication refill. EXAM: 1. Ultrasound-guided vascular access of the left popliteal vein. 2. Left lower extremity venogram. 3. Intravascular ultrasound. 4. Catheter and wire recanalization of the left femoral and iliac veins. 5. Balloon venoplasty of the left femoral and iliac veins. 6.  Mechanical thrombectomy of the left femoral and iliac veins. 7.  Iliofemoral stent placement. COMPARISON:  11/18/2022 MEDICATIONS: 11000 units heparin, intravenous ANESTHESIA/SEDATION: The procedure was performed under general anesthesia. FLUOROSCOPY TIME:  One hundred forty-three mGy CONTRAST:  80 mL visipaque 320, intravenous COMPLICATIONS: None immediate. TECHNIQUE: Informed written consent was obtained from the patient after a thorough discussion of the procedural risks, benefits and alternatives. All questions were addressed. Maximal Sterile Barrier Technique was utilized including caps, mask, sterile gowns, sterile gloves, sterile drape, hand hygiene and skin antiseptic. A timeout was performed prior to the initiation of the procedure. The patient was positioned prone on the interventional radiology table. Preprocedure ultrasound evaluation of the left popliteal region demonstrated a patent appearing popliteal vein. The procedure was planned. A small skin nick was made at the planned needle entry site. Under direct ultrasound visualization, a 21 gauge micropuncture was used to puncture the popliteal vein. A Nitrex wire was inserted and directed into the mid femoral vein. A micropuncture sheath was then introduced and left lower extremity venogram was performed. There is patency of the peripheral femoral vein with multiple collaterals arising in the mid femoral vein. A central femoral vein appeared patent to the level of the femoral head where there is occlusion with multiple collateral stopping into the pelvis. Over a Bentson wire, the micropuncture sheath was exchanged for an 8 [place] vascular sheath. Next, an angled Navicross catheter and stiff Glidewire were used to recanalized the femoral, common femoral, external iliac, and common iliac veins to the level of the inferior vena cava. An Amplatz wire was placed in the Navicross catheter was removed. Intravascular ultrasound was then performed throughout the left lower extremity and left pelvic veins. Intravascular  ultrasound was significant for near complete occlusion of the femoral, common femoral, external iliac, and common iliac veins. The inferior vena cava was patent. The Amplatz wire was then directed to the level of the right subclavian vein. Over the wire, balloon venoplasty was performed throughout the occluded veins extending from the common iliac vein to the peripheral aspect of the femoral vein with an 8 mm x 100 mm Atheletis balloon in multiple stations. Intravascular ultrasound was then repeated which demonstrated a patent channel through the previously occluded veins. There was noticeable extrinsic compression at the level of the central common femoral vein secondary to the overlying right common iliac artery. Next, the 8 [place] sheath was upsized for a 13 [place] ClotTriever sheath. The ClotTriever device was then directed into the inferior vena cava and a total of 3 passes were performed throughout the iliofemoral occluded segment. Scant, chronic appearing thrombus was removed. Pelvic venogram was performed which demonstrated no significant opacification of the iliac veins. Next, balloon venoplasty was performed with a 14 mm x 4 cm atlas balloon through the common and external iliac as well as the common femoral vein. This was followed by placement of a 16 mm x 100 mm Abre self-expanding stent positioned at the level of the common iliac vein extending into the central aspect of the external iliac vein. An additional 16 mm x 150 mm Abre self-expanding stent was placed with approximately 4 cm of overlap extending to the peripheral aspect of the common femoral vein. Post deployment balloon molding with a 14 mm atlas balloon was performed throughout. Next, additional venoplasty was performed throughout the femoral vein with the 8 mm x 100 mm Atheltis balloon. Repeat intravascular ultrasound demonstrated significantly improved patency throughout the femoral vein with excellent wall apposition of the  newly placed  stents in the iliofemoral segment which were patent throughout. Completion venogram of the femoral vein demonstrated significantly improved patency and flow with diminished flow into the previously visualized collateral veins in the mid femoral region and pelvis. Pelvic venogram demonstrated widely patent newly placed stents with brisk antegrade flow into the patent inferior vena cava. The catheters and wires were removed. An absorbable suture was used tire pursestring at the sheath entry site and manual compression was applied to achieve hemostasis. A sterile bandage was applied. The patient received 1 milligram/kilogram Lovenox subcutaneously prior to departing the IR department. The patient tolerated procedure well and was transferred to the postanesthesia care unit in good condition. IMPRESSION: 1. 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 2. Technically successful catheter and wire recanalization of the occluded iliofemoral venous segment followed by technically successful balloon venoplasty, mechanical thrombectomy, and self expanding stent placement extending from the common iliac vein through the peripheral aspect of the common femoral vein. PLAN: Plan for 1 milligram/kilogram Lovenox for at least 1 month. Begin daily 81 mg aspirin. Plan for 1 month CT venogram abdomen pelvis with left lower extremity venous duplex and interventional radiology clinic follow-up shortly thereafter. Andrew Coots, MD [facility] Electronically Signed   By: Andrew Kaiser M.D.   On: 12/20/2022 21:13   IR IVUS EACH ADDITIONAL NON CORONARY VESSEL  Result Date: 12/20/2022 INDICATION: 62 year old male with a history of severe left lower extremity post-thrombotic syndrome (Villalta 20) secondary to May-Thurner Syndrome after acute  iliofemoral DVT in December 2023 status post thrombectomy with early re-thrombosis. He is referred to IR after recent ED visit for lower extremity pain and shortness of breath, at the time non-compliant with anticoagulation. He is now compliant after getting medication refill. EXAM: 1. Ultrasound-guided vascular access of the left popliteal vein. 2. Left lower extremity venogram. 3. Intravascular ultrasound. 4. Catheter and wire recanalization of the left femoral and iliac veins. 5. Balloon venoplasty of the left femoral and iliac veins. 6. Mechanical thrombectomy of the left femoral and iliac veins. 7. Iliofemoral stent placement. COMPARISON:  11/18/2022 MEDICATIONS: 11000 units heparin, intravenous ANESTHESIA/SEDATION: The procedure was performed under general anesthesia. FLUOROSCOPY TIME:  One hundred forty-three mGy CONTRAST:  80 mL visipaque 320, intravenous COMPLICATIONS: None immediate. TECHNIQUE: Informed written consent was obtained from the patient after a thorough discussion of the procedural risks, benefits and alternatives. All questions were addressed. Maximal Sterile Barrier Technique was utilized including caps, mask, sterile gowns, sterile gloves, sterile drape, hand hygiene and skin antiseptic. A timeout was performed prior to the initiation of the procedure. The patient was positioned prone on the interventional radiology table. Preprocedure ultrasound evaluation of the left popliteal region demonstrated a patent appearing popliteal vein. The procedure was planned. A small skin nick was made at the planned needle entry site. Under direct ultrasound visualization, a 21 gauge micropuncture was used to puncture the popliteal vein. A Nitrex wire was inserted and directed into the mid femoral vein. A micropuncture sheath was then introduced and left lower extremity venogram was performed. There is patency of the peripheral femoral vein with multiple collaterals arising in the mid femoral vein. A  central femoral vein appeared patent to the level of the femoral head where there is occlusion with multiple collateral stopping into the pelvis. Over a Bentson wire, the micropuncture sheath was exchanged for an  8 [place] vascular sheath. Next, an angled Navicross catheter and stiff Glidewire were used to recanalized the femoral, common femoral, external iliac, and common iliac veins to the level of the inferior vena cava. An Amplatz wire was placed in the Navicross catheter was removed. Intravascular ultrasound was then performed throughout the left lower extremity and left pelvic veins. Intravascular ultrasound was significant for near complete occlusion of the femoral, common femoral, external iliac, and common iliac veins. The inferior vena cava was patent. The Amplatz wire was then directed to the level of the right subclavian vein. Over the wire, balloon venoplasty was performed throughout the occluded veins extending from the common iliac vein to the peripheral aspect of the femoral vein with an 8 mm x 100 mm Atheletis balloon in multiple stations. Intravascular ultrasound was then repeated which demonstrated a patent channel through the previously occluded veins. There was noticeable extrinsic compression at the level of the central common femoral vein secondary to the overlying right common iliac artery. Next, the 8 [place] sheath was upsized for a 13 [place] ClotTriever sheath. The ClotTriever device was then directed into the inferior vena cava and a total of 3 passes were performed throughout the iliofemoral occluded segment. Scant, chronic appearing thrombus was removed. Pelvic venogram was performed which demonstrated no significant opacification of the iliac veins. Next, balloon venoplasty was performed with a 14 mm x 4 cm atlas balloon through the common and external iliac as well as the common femoral vein. This was followed by placement of a 16 mm x 100 mm Abre self-expanding stent positioned at the  level of the common iliac vein extending into the central aspect of the external iliac vein. An additional 16 mm x 150 mm Abre self-expanding stent was placed with approximately 4 cm of overlap extending to the peripheral aspect of the common femoral vein. Post deployment balloon molding with a 14 mm atlas balloon was performed throughout. Next, additional venoplasty was performed throughout the femoral vein with the 8 mm x 100 mm Atheltis balloon. Repeat intravascular ultrasound demonstrated significantly improved patency throughout the femoral vein with excellent wall apposition of the newly placed stents in the iliofemoral segment which were patent throughout. Completion venogram of the femoral vein demonstrated significantly improved patency and flow with diminished flow into the previously visualized collateral veins in the mid femoral region and pelvis. Pelvic venogram demonstrated widely patent newly placed stents with brisk antegrade flow into the patent inferior vena cava. The catheters and wires were removed. An absorbable suture was used tire pursestring at the sheath entry site and manual compression was applied to achieve hemostasis. A sterile bandage was applied. The patient received 1 milligram/kilogram Lovenox subcutaneously prior to departing the IR department. The patient tolerated procedure well and was transferred to the postanesthesia care unit in good condition. IMPRESSION: 1. 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 2. Technically successful catheter and wire recanalization of the occluded iliofemoral venous segment followed by technically successful balloon venoplasty, mechanical thrombectomy, and self expanding stent placement extending from the common iliac vein through the peripheral aspect of the common femoral vein. PLAN: Plan for 1  milligram/kilogram Lovenox for at least 1 month. Begin daily 81 mg aspirin. Plan for 1 month CT venogram abdomen pelvis with left lower extremity venous duplex and interventional radiology clinic follow-up shortly thereafter. Andrew Coots, MD [facility] Radiology  Electronically Signed   By: Andrew Kaiser M.D.   On: 12/20/2022 21:13   IR THROMBECT PRIM MECH INIT (INCLU) MOD SED  Result Date: 12/20/2022 INDICATION: 63 year old male with a history of severe left lower extremity post-thrombotic syndrome ([place] [facility]) secondary to May-Thurner Syndrome after acute iliofemoral DVT in December 2023 status post thrombectomy with early re-thrombosis. He is referred to IR after recent ED visit for lower extremity pain and shortness of breath, at the time non-compliant with anticoagulation. He is now compliant after getting medication refill. EXAM: 1. Ultrasound-guided vascular access of the left popliteal vein. 2. Left lower extremity venogram. 3. Intravascular ultrasound. 4. Catheter and wire recanalization of the left femoral and iliac veins. 5. Balloon venoplasty of the left femoral and iliac veins. 6. Mechanical thrombectomy of the left femoral and iliac veins. 7. Iliofemoral stent placement. COMPARISON:  11/18/2022 MEDICATIONS: 11000 units heparin, intravenous ANESTHESIA/SEDATION: The procedure was performed under general anesthesia. FLUOROSCOPY TIME:  One hundred forty-three mGy CONTRAST:  80 mL visipaque 320, intravenous COMPLICATIONS: None immediate. TECHNIQUE: Informed written consent was obtained from the patient after a thorough discussion of the procedural risks, benefits and alternatives. All questions were addressed. Maximal Sterile Barrier Technique was utilized including caps, mask, sterile gowns, sterile gloves, sterile drape, hand hygiene and skin antiseptic. A timeout was performed prior to the initiation of the procedure. The patient was positioned prone  on the interventional radiology table. Preprocedure ultrasound evaluation of the left popliteal region demonstrated a patent appearing popliteal vein. The procedure was planned. A small skin nick was made at the planned needle entry site. Under direct ultrasound visualization, a 21 gauge micropuncture was used to puncture the popliteal vein. A Nitrex wire was inserted and directed into the mid femoral vein. A micropuncture sheath was then introduced and left lower extremity venogram was performed. There is patency of the peripheral femoral vein with multiple collaterals arising in the mid femoral vein. A central femoral vein appeared patent to the level of the femoral head where there is occlusion with multiple collateral stopping into the pelvis. Over a Bentson wire, the micropuncture sheath was exchanged for an 8 [place] vascular sheath. Next, an angled Navicross catheter and stiff Glidewire were used to recanalized the femoral, common femoral, external iliac, and common iliac veins to the level of the inferior vena cava. An Amplatz wire was placed in the Navicross catheter was removed. Intravascular ultrasound was then performed throughout the left lower extremity and left pelvic veins. Intravascular ultrasound was significant for near complete occlusion of the femoral, common femoral, external iliac, and common iliac veins. The inferior vena cava was patent. The Amplatz wire was then directed to the level of the right subclavian vein. Over the wire, balloon venoplasty was performed throughout the occluded veins extending from the common iliac vein to the peripheral aspect of the femoral vein with an 8 mm x 100 mm Atheletis balloon in multiple stations. Intravascular ultrasound was then repeated which demonstrated a patent channel through the previously occluded veins. There was noticeable extrinsic compression at the level of the central common femoral vein secondary to the overlying right common iliac artery.  Next, the 8 [place] sheath was upsized for a 13 [place] ClotTriever sheath. The ClotTriever device was then directed into the inferior vena cava and a total of 3 passes were performed throughout the iliofemoral occluded segment. Scant, chronic appearing thrombus was removed. Pelvic venogram was performed which demonstrated no significant opacification of the iliac veins. Next, balloon venoplasty was performed  with a 14 mm x 4 cm atlas balloon through the common and external iliac as well as the common femoral vein. This was followed by placement of a 16 mm x 100 mm Abre self-expanding stent positioned at the level of the common iliac vein extending into the central aspect of the external iliac vein. An additional 16 mm x 150 mm Abre self-expanding stent was placed with approximately 4 cm of overlap extending to the peripheral aspect of the common femoral vein. Post deployment balloon molding with a 14 mm atlas balloon was performed throughout. Next, additional venoplasty was performed throughout the femoral vein with the 8 mm x 100 mm Atheltis balloon. Repeat intravascular ultrasound demonstrated significantly improved patency throughout the femoral vein with excellent wall apposition of the newly placed stents in the iliofemoral segment which were patent throughout. Completion venogram of the femoral vein demonstrated significantly improved patency and flow with diminished flow into the previously visualized collateral veins in the mid femoral region and pelvis. Pelvic venogram demonstrated widely patent newly placed stents with brisk antegrade flow into the patent inferior vena cava. The catheters and wires were removed. An absorbable suture was used tire pursestring at the sheath entry site and manual compression was applied to achieve hemostasis. A sterile bandage was applied. The patient received 1 milligram/kilogram Lovenox subcutaneously prior to departing the IR department. The patient tolerated procedure well  and was transferred to the postanesthesia care unit in good condition. IMPRESSION: 1. 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 2. Technically successful catheter and wire recanalization of the occluded iliofemoral venous segment followed by technically successful balloon venoplasty, mechanical thrombectomy, and self expanding stent placement extending from the common iliac vein through the peripheral aspect of the common femoral vein. PLAN: Plan for 1 milligram/kilogram Lovenox for at least 1 month. Begin daily 81 mg aspirin. Plan for 1 month CT venogram abdomen pelvis with left lower extremity venous duplex and interventional radiology clinic follow-up shortly thereafter. Andrew Coots, MD [facility] Electronically Signed   By: Andrew Kaiser M.D.   On: 12/20/2022 21:13   IR Radiologist Eval & Mgmt  Result Date: 12/01/2022 EXAM: NEW PATIENT OFFICE VISIT CHIEF COMPLAINT: See Epic note. HISTORY OF PRESENT ILLNESS: See Epic note. REVIEW OF SYSTEMS: See Epic note. PHYSICAL EXAMINATION: See Epic note. ASSESSMENT AND PLAN: See Epic note. Andrew Coots, MD [facility] Electronically Signed   By: Andrew Kaiser M.D.   On: 12/01/2022 13:38    Treatments: Observation   Discharge Exam: Blood pressure 126/74, pulse 70, temperature 98.2 F (36.8 C), temperature source Oral, resp. rate 19, height 6' (1.829 m), weight 208 lb (94.3 kg), SpO2 100%. Physical Exam Constitutional:      General: He is not in acute distress.    Appearance: He is not ill-appearing.  HENT:     Mouth/Throat:     Mouth: Mucous membranes are moist.     Pharynx: Oropharynx is clear.  Cardiovascular:     Rate and Rhythm: Normal rate.  Pulmonary:     Effort: Pulmonary effort is normal.   Abdominal:     Tenderness: There is no abdominal tenderness.  Musculoskeletal:     Comments: Bilateral TED hose in place.   Skin:    General: Skin is warm and dry.     Comments: Left posterior knee procedure  site covered with gauze/tegaderm. Site is clean/dry and non-tender.   Neurological:     Mental Status: He is alert and oriented to person, place, and time.  Psychiatric:        Mood and Affect: Mood normal.        Behavior: Behavior normal.        Thought Content: Thought content normal.        Judgment: Judgment normal.     Disposition: Discharge disposition: 01-Home or Self Care       Allergies as of 12/21/2022   No Known Allergies      Medication List     STOP taking these medications    apixaban 5 MG Tabs tablet Commonly known as: ELIQUIS   Apixaban Starter Pack (10mg  and 5mg ) Commonly known as: ELIQUIS STARTER PACK       TAKE these medications    aspirin 81 MG chewable tablet Chew 1 tablet (81 mg total) by mouth daily.   baclofen 10 MG tablet Commonly known as: LIORESAL Take 10-20 mg by mouth 2 (two) times daily as needed for muscle spasms.   cetirizine 10 MG tablet Commonly known as: ZYRTEC Take 10 mg by mouth daily as needed for allergies.   enoxaparin 100 MG/ML injection Commonly known as: LOVENOX Inject 1 mL (100 mg total) into the skin every 12 (twelve) hours.   multivitamin with minerals Tabs tablet Take 1 tablet by mouth daily.   Oxcarbazepine 300 MG tablet Commonly known as: TRILEPTAL Take 300 mg by mouth 2 (two) times daily.   zolpidem 5 MG tablet Commonly known as: AMBIEN Take 1 tablet (5 mg total) by mouth at bedtime as needed for sleep.        Follow-up Information     [facility] Follow up.   Why: Please follow up with Dr. Elby Kaiser in approximately one month. A scheduler from our office will call you with a date/time of your appointment. Prior to meeting with Dr. Elby Kaiser you will get a CT Venogram  Abdomen/Pelvis. You will also get a left lower extremity venous duplex (ultrasound). Please call our office if you have any questions/concerns prior to your visit. Contact information: [address] [place] [address] [phone]                  Electronically Signed: Mickie Kay, NP 12/21/2022, 9:29 AM   I have spent Less Than 30 Minutes discharging AYOUB AREY.

## 2022-12-21 NOTE — Plan of Care (Signed)

## 2022-12-21 NOTE — Progress Notes (Signed)
CM spoke with Linus Mako, RN Charge Nurse and patient is requesting RW prior to discharge to home.  DME order was placed and Basilia Jumbo, NP was requested to co-sign.  Patient did not have a preference.  [employer] was called to deliver a RW to the bedside prior to discharge to home.  [employer] should deliver equipment to the room in the next 1-1.5 hours.

## 2022-12-21 NOTE — Progress Notes (Signed)
Ok to give kdur x1 for low k per Dr. Elby Showers.  Ulyses Southward, PharmD, BCIDP, AAHIVP, CPP Infectious Disease Pharmacist 12/21/2022 8:10 AM

## 2022-12-22 ENCOUNTER — Encounter (HOSPITAL_COMMUNITY): Payer: Self-pay | Admitting: Interventional Radiology

## 2022-12-31 ENCOUNTER — Encounter (HOSPITAL_COMMUNITY): Payer: Self-pay | Admitting: Interventional Radiology

## 2022-12-31 NOTE — Addendum Note (Signed)
Addendum  created 12/31/22 2313 by Beryle Lathe, MD   Intraprocedure Event edited, Intraprocedure Staff edited

## 2023-01-19 ENCOUNTER — Other Ambulatory Visit: Payer: Self-pay | Admitting: Interventional Radiology

## 2023-01-19 DIAGNOSIS — I82412 Acute embolism and thrombosis of left femoral vein: Secondary | ICD-10-CM

## 2023-01-20 ENCOUNTER — Other Ambulatory Visit: Payer: Self-pay | Admitting: Interventional Radiology

## 2023-01-20 ENCOUNTER — Observation Stay (HOSPITAL_COMMUNITY): Payer: MEDICAID

## 2023-01-20 DIAGNOSIS — I871 Compression of vein: Secondary | ICD-10-CM

## 2023-01-20 DIAGNOSIS — I82412 Acute embolism and thrombosis of left femoral vein: Secondary | ICD-10-CM

## 2023-02-01 ENCOUNTER — Other Ambulatory Visit: Payer: MEDICAID

## 2023-03-22 ENCOUNTER — Encounter (HOSPITAL_COMMUNITY): Payer: Self-pay

## 2023-03-22 ENCOUNTER — Ambulatory Visit (HOSPITAL_COMMUNITY)
Admission: RE | Admit: 2023-03-22 | Discharge: 2023-03-22 | Disposition: A | Discharge status: Home / Self Care | Payer: MEDICAID | Source: Ambulatory Visit | Attending: Emergency Medicine | Admitting: Emergency Medicine

## 2023-03-22 VITALS — BP 109/73 | HR 86 | Temp 98.3°F | Resp 16 | Ht 72.0 in | Wt 215.0 lb

## 2023-03-22 DIAGNOSIS — J069 Acute upper respiratory infection, unspecified: Secondary | ICD-10-CM | POA: Diagnosis present

## 2023-03-22 MED ORDER — PROMETHAZINE-DM 6.25-15 MG/5ML PO SYRP: 5.0000 mL | ORAL_SOLUTION | Freq: Four times a day (QID) | ORAL | 0 refills | Status: AC | PRN

## 2023-03-22 MED ORDER — PSEUDOEPHEDRINE HCL 30 MG PO TABS: 30.0000 mg | ORAL_TABLET | ORAL | 0 refills | Status: AC | PRN

## 2023-03-22 NOTE — Discharge Instructions (Addendum)
 Sudafed can be used every 4 hours for congestion The promethazine  DM cough syrup can be used up to 4 times daily. If this medication makes you drowsy, take only once before bed. Drink LOTS of fluids!  We will call you if your covid test returns positive. It can take about 24 hours.

## 2023-03-22 NOTE — ED Triage Notes (Signed)
"  Chronic cough, congestion, chills - Entered by patient"  Onset yesterday. Patient has been around some sick people, unknown what they had. Dry cough.   Patient tried mucus relief and otc pain med with mild relief.

## 2023-03-22 NOTE — ED Provider Notes (Signed)
 [facility]    CSN: [id number] Arrival date & time: 03/22/23  1500     History   Chief Complaint Chief Complaint  Patient presents with   Cough   Appointment    HPI Andrew Kaiser is a 63 y.o. male.  2 day history of congestion and dry cough Chills but no fever Denies chest pain or shortness of breath. 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   Bipolar I disorder, most recent episode depressed (HCC) 02/19/2022   Acute deep vein thrombosis (DVT) of femoral vein of left lower extremity (HCC) 02/17/2022   Transaminitis 02/14/2022   Acute deep vein thrombosis (DVT) of proximal vein of left lower extremity (HCC) 02/13/2022   Acute embolism and thrombosis of unspecified deep veins of left lower extremity (HCC) 02/13/2022   Pulmonary embolism (HCC) 02/13/2022   Tobacco dependence 07/18/2017   Memory disturbance 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   Bipolar disorder, current episode manic without psychotic features, severe (HCC) 01/17/2011   Cerebral vascular disease 01/17/2011    Class: History of   ALLERGIC RHINITIS 10/27/2006    Past Surgical History:  Procedure Laterality Date   COLONOSCOPY     IR INTRAVASCULAR ULTRASOUND NON CORONARY  12/20/2022   IR RADIOLOGIST EVAL & MGMT  12/01/2022   IR THROMBECT PRIM MECH INIT (INCLU) MOD SED  12/20/2022   IR TRANSCATH PLC STENT 1ST ART NOT LE CV CAR VERT CAR  12/20/2022   IR US  GUIDE VASC ACCESS LEFT  12/20/2022   IVC VENOGRAPHY N/A 04/14/2022   Procedure: IVC Venography;  Surgeon: Lanis Fonda BRAVO, MD;  Location: [facility]  INVASIVE CV LAB;  Service: Cardiovascular;  Laterality: N/A;   LOWER EXTREMITY VENOGRAPHY Left 04/14/2022   Procedure: LOWER EXTREMITY VENOGRAPHY;  Surgeon: Lanis Fonda BRAVO, MD;  Location: [facility] INVASIVE CV LAB;  Service: Cardiovascular;  Laterality: Left;   PATENT FORAMEN OVALE CLOSURE  2006   PERIPHERAL VASCULAR BALLOON ANGIOPLASTY Left 02/17/2022   Procedure: PERIPHERAL VASCULAR BALLOON ANGIOPLASTY;  Surgeon: Lanis Fonda BRAVO, MD;  Location: [facility] INVASIVE CV LAB;  Service: Cardiovascular;  Laterality: Left;   PERIPHERAL VASCULAR THROMBECTOMY N/A 02/17/2022   Procedure: VENOUS THROMBECTOMY;  Surgeon: Lanis Fonda BRAVO, MD;  Location: [facility] INVASIVE CV LAB;  Service: Cardiovascular;  Laterality: N/A;   POLYPECTOMY     RADIOLOGY WITH ANESTHESIA N/A 12/20/2022   Procedure: LLE Venogram and Recanalization with Possible Left Iliac Vein Stent Placement;  Surgeon: Jennefer Ester PARAS, MD;  Location: [facility] OR;  Service: Radiology;  Laterality: N/A;   RHINOPLASTY         Home Medications    Prior to Admission medications   Medication Sig Start Date End Date Taking? Authorizing Provider  aspirin  81 MG chewable tablet Chew 1 tablet (81 mg total) by mouth daily. 12/21/22  Yes Covington, Jamie R, NP  Multiple Vitamin (MULTIVITAMIN WITH MINERALS) TABS tablet Take 1 tablet by mouth daily.   Yes [provider]  promethazine -dextromethorphan (PROMETHAZINE -DM) 6.25-15 MG/5ML  syrup Take 5 mLs by mouth 4 (four) times daily as needed for cough. 03/22/23  Yes Suheyb Raucci, Asberry, PA-C  pseudoephedrine  (SUDAFED) 30 MG tablet Take 1 tablet (30 mg total) by mouth every 4 (four) hours as needed for congestion. 03/22/23  Yes Jabaree Mercado, Asberry, PA-C  enoxaparin  (LOVENOX ) 100 MG/ML injection Inject 1 mL (100 mg total) into the skin every 12 (twelve) hours. 12/21/22 02/19/23  Tonette Warren SAUNDERS, NP    Family History Family History  Problem Relation Age of Onset   Stroke Mother    Heart disease Father    Asthma Father    Stroke  Sister    Multiple sclerosis Sister    Colon cancer Neg Hx    Pancreatic cancer Neg Hx    Rectal cancer Neg Hx    Stomach cancer Neg Hx    Esophageal cancer Neg Hx    Prostate cancer Neg Hx    Colon polyps Neg Hx     Social History Social History   Tobacco Use   Smoking status: Some Days    Types: Cigars    Passive exposure: Current   Smokeless tobacco: Never  Vaping Use   Vaping status: Never Used  Substance Use Topics   Alcohol use: Yes    Alcohol/week: 0.0 standard drinks of alcohol    Comment: socially   Drug use: Not Currently     Allergies   Patient has no known allergies.   Review of Systems Review of Systems  Respiratory:  Positive for cough.   Per HPI  Physical Exam Triage Vital Signs ED Triage Vitals  Encounter Vitals Group     BP 03/22/23 1530 109/73     Systolic BP Percentile --      Diastolic BP Percentile --      Pulse Rate 03/22/23 1530 85     Resp 03/22/23 1530 16     Temp 03/22/23 1530 98.3 F (36.8 C)     Temp Source 03/22/23 1530 Oral     SpO2 03/22/23 1530 93 %     Weight 03/22/23 1530 215 lb (97.5 kg)     Height 03/22/23 1530 6' (1.829 m)     Head Circumference --      Peak Flow --      Pain Score 03/22/23 1528 0     Pain Loc --      Pain Education --      Exclude from Growth Chart --    No data found.  Updated Vital Signs BP 109/73 (BP Location: Right Arm)   Pulse 86   Temp 98.3 F (36.8 C) (Oral)   Resp 16   Ht 6' (1.829 m)   Wt 215 lb (97.5 kg)   SpO2 96%   BMI 29.16 kg/m     Physical Exam Vitals and nursing note reviewed.  Constitutional:      General: He is not in acute distress.    Appearance: He is not ill-appearing.  HENT:     Right Ear: Tympanic membrane and ear canal normal.     Left Ear: Tympanic membrane and ear canal normal.     Nose: Congestion (mild) present. No rhinorrhea.     Mouth/Throat:     Mouth: Mucous membranes are moist.     Pharynx: Oropharynx is clear. No posterior oropharyngeal  erythema.  Eyes:     Conjunctiva/sclera: Conjunctivae normal.  Cardiovascular:     Rate and Rhythm: Normal rate and regular rhythm.     Pulses: Normal  pulses.     Heart sounds: Normal heart sounds.  Pulmonary:     Effort: Pulmonary effort is normal. No respiratory distress.     Breath sounds: Normal breath sounds. No wheezing or rales.  Musculoskeletal:     Cervical back: Normal range of motion.  Lymphadenopathy:     Cervical: No cervical adenopathy.  Skin:    General: Skin is warm and dry.  Neurological:     Mental Status: He is alert and oriented to person, place, and time.     UC Treatments / Results  Labs (all labs ordered are listed, but only abnormal results are displayed) Labs Reviewed  SARS CORONAVIRUS 2 (TAT 6-24 HRS)    EKG  Radiology No results found.  Procedures Procedures   Medications Ordered in UC Medications - No data to display  Initial Impression / Assessment and Plan / UC Course  I have reviewed the triage vital signs and the nursing notes.  Pertinent labs & imaging results that were available during my care of the patient were reviewed by me and considered in my medical decision making (see chart for details).  Afebrile and normal HR. O2 was initially sating 93% but improved to 96-97 without intervention. Lungs clear. Overall well appearing Discussed viral etiology Patient requesting covid test - pending. No change to tx plan if positive. Discussed viral etiology and symptomatic care. Sent sudafed and promethazine  DM to use if helpful. Advised reasons to return to clinic. Patient agrees to plan, no questions at this time  Final Clinical Impressions(s) / UC Diagnoses   Final diagnoses:  Viral URI with cough     Discharge Instructions      Sudafed can be used every 4 hours for congestion The promethazine  DM cough syrup can be used up to 4 times daily. If this medication makes you drowsy, take only once before bed. Drink LOTS of  fluids!  We will call you if your covid test returns positive. It can take about 24 hours.      ED Prescriptions     Medication Sig Dispense Auth. Provider   pseudoephedrine  (SUDAFED) 30 MG tablet Take 1 tablet (30 mg total) by mouth every 4 (four) hours as needed for congestion. 30 tablet Abhishek Levesque, PA-C   promethazine -dextromethorphan (PROMETHAZINE -DM) 6.25-15 MG/5ML syrup Take 5 mLs by mouth 4 (four) times daily as needed for cough. 240 mL Delphin Funes, Asberry, PA-C      PDMP not reviewed this encounter.   Jeryl Asberry RIGGERS 03/22/23 1919

## 2023-03-23 LAB — SARS CORONAVIRUS 2 (TAT 6-24 HRS): SARS Coronavirus 2: NEGATIVE

## 2023-12-16 ENCOUNTER — Encounter (HOSPITAL_COMMUNITY): Payer: Self-pay | Admitting: Student
